# Patient Record
Sex: Male | Born: 1965 | Race: White | Hispanic: No | Smoking: Current every day smoker
Health system: Southern US, Community
[De-identification: ages and names within clinical notes are randomized; demographics above are authoritative.]

## PROBLEM LIST (undated history)

## (undated) DIAGNOSIS — Z91199 Patient's noncompliance with other medical treatment and regimen due to unspecified reason: Secondary | ICD-10-CM

## (undated) DIAGNOSIS — F329 Major depressive disorder, single episode, unspecified: Secondary | ICD-10-CM

## (undated) DIAGNOSIS — F1010 Alcohol abuse, uncomplicated: Secondary | ICD-10-CM

## (undated) DIAGNOSIS — M549 Dorsalgia, unspecified: Secondary | ICD-10-CM

## (undated) DIAGNOSIS — F1210 Cannabis abuse, uncomplicated: Secondary | ICD-10-CM

## (undated) DIAGNOSIS — F32A Depression, unspecified: Secondary | ICD-10-CM

## (undated) DIAGNOSIS — F1410 Cocaine abuse, uncomplicated: Secondary | ICD-10-CM

## (undated) DIAGNOSIS — I10 Essential (primary) hypertension: Secondary | ICD-10-CM

## (undated) DIAGNOSIS — R296 Repeated falls: Secondary | ICD-10-CM

## (undated) DIAGNOSIS — Z9119 Patient's noncompliance with other medical treatment and regimen: Secondary | ICD-10-CM

## (undated) DIAGNOSIS — F419 Anxiety disorder, unspecified: Secondary | ICD-10-CM

## (undated) DIAGNOSIS — G8929 Other chronic pain: Secondary | ICD-10-CM

## (undated) HISTORY — DX: Cannabis abuse, uncomplicated: F12.10

## (undated) HISTORY — DX: Cocaine abuse, uncomplicated: F14.10

## (undated) HISTORY — DX: Depression, unspecified: F32.A

## (undated) HISTORY — DX: Anxiety disorder, unspecified: F41.9

## (undated) HISTORY — DX: Major depressive disorder, single episode, unspecified: F32.9

---

## 1998-01-28 ENCOUNTER — Inpatient Hospital Stay (HOSPITAL_COMMUNITY): Admission: RE | Admit: 1998-01-28 | Discharge: 1998-01-31 | Payer: Self-pay | Admitting: *Deleted

## 1998-03-22 ENCOUNTER — Inpatient Hospital Stay (HOSPITAL_COMMUNITY): Admission: EM | Admit: 1998-03-22 | Discharge: 1998-03-24 | Payer: Self-pay | Admitting: Psychiatry

## 2001-10-23 ENCOUNTER — Observation Stay (HOSPITAL_COMMUNITY): Admission: AD | Admit: 2001-10-23 | Discharge: 2001-10-24 | Payer: Self-pay | Admitting: Internal Medicine

## 2002-01-30 ENCOUNTER — Ambulatory Visit (HOSPITAL_COMMUNITY): Admission: RE | Admit: 2002-01-30 | Discharge: 2002-01-30 | Payer: Self-pay | Admitting: Family Medicine

## 2002-01-30 ENCOUNTER — Encounter: Payer: Self-pay | Admitting: Family Medicine

## 2002-02-02 ENCOUNTER — Ambulatory Visit (HOSPITAL_COMMUNITY): Admission: RE | Admit: 2002-02-02 | Discharge: 2002-02-02 | Payer: Self-pay | Admitting: Family Medicine

## 2002-02-02 ENCOUNTER — Encounter: Payer: Self-pay | Admitting: Family Medicine

## 2002-10-03 ENCOUNTER — Emergency Department (HOSPITAL_COMMUNITY): Admission: EM | Admit: 2002-10-03 | Discharge: 2002-10-03 | Payer: Self-pay | Admitting: *Deleted

## 2003-05-25 ENCOUNTER — Inpatient Hospital Stay (HOSPITAL_COMMUNITY): Admission: AD | Admit: 2003-05-25 | Discharge: 2003-05-28 | Payer: Self-pay | Admitting: Psychiatry

## 2004-04-18 ENCOUNTER — Emergency Department (HOSPITAL_COMMUNITY): Admission: EM | Admit: 2004-04-18 | Discharge: 2004-04-18 | Payer: Self-pay | Admitting: Emergency Medicine

## 2004-07-23 ENCOUNTER — Ambulatory Visit (HOSPITAL_COMMUNITY): Admission: RE | Admit: 2004-07-23 | Discharge: 2004-07-23 | Payer: Self-pay | Admitting: Family Medicine

## 2005-02-09 ENCOUNTER — Ambulatory Visit (HOSPITAL_COMMUNITY): Admission: RE | Admit: 2005-02-09 | Discharge: 2005-02-09 | Payer: Self-pay | Admitting: Family Medicine

## 2005-05-13 ENCOUNTER — Ambulatory Visit (HOSPITAL_COMMUNITY): Admission: RE | Admit: 2005-05-13 | Discharge: 2005-05-14 | Payer: Self-pay | Admitting: Neurosurgery

## 2006-08-25 ENCOUNTER — Ambulatory Visit: Payer: Self-pay | Admitting: Orthopedic Surgery

## 2006-09-01 ENCOUNTER — Encounter (HOSPITAL_COMMUNITY): Admission: RE | Admit: 2006-09-01 | Discharge: 2006-10-01 | Payer: Self-pay | Admitting: Orthopedic Surgery

## 2006-09-05 ENCOUNTER — Ambulatory Visit: Payer: Self-pay | Admitting: Orthopedic Surgery

## 2006-11-07 ENCOUNTER — Ambulatory Visit: Payer: Self-pay | Admitting: Orthopedic Surgery

## 2006-11-11 ENCOUNTER — Ambulatory Visit: Payer: Self-pay | Admitting: Orthopedic Surgery

## 2006-11-11 ENCOUNTER — Ambulatory Visit (HOSPITAL_COMMUNITY): Admission: RE | Admit: 2006-11-11 | Discharge: 2006-11-11 | Payer: Self-pay | Admitting: Orthopedic Surgery

## 2006-11-14 ENCOUNTER — Ambulatory Visit: Payer: Self-pay | Admitting: Orthopedic Surgery

## 2006-12-06 ENCOUNTER — Ambulatory Visit (HOSPITAL_COMMUNITY): Admission: RE | Admit: 2006-12-06 | Discharge: 2006-12-06 | Payer: Self-pay | Admitting: Orthopedic Surgery

## 2006-12-06 ENCOUNTER — Ambulatory Visit: Payer: Self-pay | Admitting: Orthopedic Surgery

## 2006-12-27 ENCOUNTER — Ambulatory Visit: Payer: Self-pay | Admitting: Orthopedic Surgery

## 2007-03-06 ENCOUNTER — Ambulatory Visit: Payer: Self-pay | Admitting: Internal Medicine

## 2007-03-06 ENCOUNTER — Telehealth (INDEPENDENT_AMBULATORY_CARE_PROVIDER_SITE_OTHER): Payer: Self-pay | Admitting: Internal Medicine

## 2007-03-06 DIAGNOSIS — K219 Gastro-esophageal reflux disease without esophagitis: Secondary | ICD-10-CM

## 2007-03-06 DIAGNOSIS — B029 Zoster without complications: Secondary | ICD-10-CM | POA: Insufficient documentation

## 2007-03-06 DIAGNOSIS — I10 Essential (primary) hypertension: Secondary | ICD-10-CM

## 2007-03-08 ENCOUNTER — Encounter (INDEPENDENT_AMBULATORY_CARE_PROVIDER_SITE_OTHER): Payer: Self-pay | Admitting: Internal Medicine

## 2007-03-08 ENCOUNTER — Telehealth (INDEPENDENT_AMBULATORY_CARE_PROVIDER_SITE_OTHER): Payer: Self-pay | Admitting: *Deleted

## 2007-03-08 ENCOUNTER — Telehealth (INDEPENDENT_AMBULATORY_CARE_PROVIDER_SITE_OTHER): Payer: Self-pay | Admitting: Internal Medicine

## 2007-07-13 HISTORY — PX: NECK SURGERY: SHX720

## 2007-10-12 ENCOUNTER — Emergency Department (HOSPITAL_COMMUNITY): Admission: EM | Admit: 2007-10-12 | Discharge: 2007-10-12 | Payer: Self-pay | Admitting: Emergency Medicine

## 2009-04-27 ENCOUNTER — Emergency Department (HOSPITAL_COMMUNITY): Admission: EM | Admit: 2009-04-27 | Discharge: 2009-04-27 | Payer: Self-pay | Admitting: Emergency Medicine

## 2009-04-28 ENCOUNTER — Ambulatory Visit (HOSPITAL_COMMUNITY): Admission: RE | Admit: 2009-04-28 | Discharge: 2009-04-28 | Payer: Self-pay | Admitting: Emergency Medicine

## 2010-09-30 ENCOUNTER — Emergency Department (HOSPITAL_COMMUNITY): Payer: Medicaid Other

## 2010-09-30 ENCOUNTER — Inpatient Hospital Stay (HOSPITAL_COMMUNITY)
Admission: EM | Admit: 2010-09-30 | Discharge: 2010-10-12 | DRG: 199 | Disposition: A | Discharge status: Home / Self Care | Payer: Medicaid Other | Source: Ambulatory Visit | Attending: Surgery | Admitting: Surgery

## 2010-09-30 ENCOUNTER — Encounter (HOSPITAL_COMMUNITY): Payer: Self-pay | Admitting: Radiology

## 2010-09-30 DIAGNOSIS — I10 Essential (primary) hypertension: Secondary | ICD-10-CM | POA: Diagnosis present

## 2010-09-30 DIAGNOSIS — Y998 Other external cause status: Secondary | ICD-10-CM

## 2010-09-30 DIAGNOSIS — S42009A Fracture of unspecified part of unspecified clavicle, initial encounter for closed fracture: Secondary | ICD-10-CM | POA: Diagnosis present

## 2010-09-30 DIAGNOSIS — F1010 Alcohol abuse, uncomplicated: Secondary | ICD-10-CM | POA: Diagnosis present

## 2010-09-30 DIAGNOSIS — J189 Pneumonia, unspecified organism: Secondary | ICD-10-CM | POA: Diagnosis present

## 2010-09-30 DIAGNOSIS — S36113A Laceration of liver, unspecified degree, initial encounter: Secondary | ICD-10-CM | POA: Diagnosis present

## 2010-09-30 DIAGNOSIS — J42 Unspecified chronic bronchitis: Secondary | ICD-10-CM | POA: Diagnosis present

## 2010-09-30 DIAGNOSIS — J9383 Other pneumothorax: Principal | ICD-10-CM | POA: Diagnosis present

## 2010-09-30 DIAGNOSIS — E119 Type 2 diabetes mellitus without complications: Secondary | ICD-10-CM | POA: Diagnosis present

## 2010-09-30 DIAGNOSIS — F17200 Nicotine dependence, unspecified, uncomplicated: Secondary | ICD-10-CM | POA: Diagnosis present

## 2010-09-30 DIAGNOSIS — R000 Tachycardia, unspecified: Secondary | ICD-10-CM | POA: Diagnosis present

## 2010-09-30 DIAGNOSIS — D62 Acute posthemorrhagic anemia: Secondary | ICD-10-CM | POA: Diagnosis present

## 2010-09-30 DIAGNOSIS — IMO0002 Reserved for concepts with insufficient information to code with codable children: Secondary | ICD-10-CM | POA: Diagnosis present

## 2010-09-30 DIAGNOSIS — S2249XA Multiple fractures of ribs, unspecified side, initial encounter for closed fracture: Secondary | ICD-10-CM | POA: Diagnosis present

## 2010-09-30 DIAGNOSIS — S32009A Unspecified fracture of unspecified lumbar vertebra, initial encounter for closed fracture: Secondary | ICD-10-CM | POA: Diagnosis present

## 2010-09-30 LAB — DIFFERENTIAL
Basophils Relative: 1 % (ref 0–1)
Eosinophils Absolute: 0.2 10*3/uL (ref 0.0–0.7)
Monocytes Absolute: 0.6 10*3/uL (ref 0.1–1.0)
Monocytes Relative: 4 % (ref 3–12)

## 2010-09-30 LAB — COMPREHENSIVE METABOLIC PANEL
AST: 36 U/L (ref 0–37)
Albumin: 3.7 g/dL (ref 3.5–5.2)
Alkaline Phosphatase: 64 U/L (ref 39–117)
BUN: 9 mg/dL (ref 6–23)
GFR calc Af Amer: >60 mL/min (ref >60)
Potassium: 3.8 meq/L (ref 3.5–5.1)
Total Protein: 6.2 g/dL (ref 6.0–8.3)

## 2010-09-30 LAB — CBC
HCT: 40.9 % (ref 39.0–52.0)
Hemoglobin: 14.3 g/dL (ref 13.0–17.0)
RBC: 4.81 MIL/uL (ref 4.22–5.81)
RDW: 13.8 % (ref 11.5–15.5)

## 2010-09-30 LAB — ABO/RH: ABO/RH(D): A POS

## 2010-09-30 LAB — PROTIME-INR: INR: 1.03 (ref 0.00–1.49)

## 2010-09-30 LAB — ETHANOL: Alcohol, Ethyl (B): 264 mg/dL — ABNORMAL HIGH (ref 0–10)

## 2010-09-30 MED ORDER — IOHEXOL 300 MG/ML  SOLN: 100.0000 mL | Freq: Once | INTRAMUSCULAR | Status: DC | PRN

## 2010-09-30 MED ORDER — IOHEXOL 300 MG/ML  SOLN
100.0000 mL | Freq: Once | INTRAMUSCULAR | Status: AC | PRN
  Administered 2010-09-30: 100 mL via INTRAVENOUS

## 2010-10-01 ENCOUNTER — Inpatient Hospital Stay (HOSPITAL_COMMUNITY): Payer: Medicaid Other

## 2010-10-01 LAB — CBC
Hemoglobin: 12.9 g/dL — ABNORMAL LOW (ref 13.0–17.0)
Platelets: 169 10*3/uL (ref 150–400)
RBC: 4.23 MIL/uL (ref 4.22–5.81)
RDW: 14.3 % (ref 11.5–15.5)
WBC: 13.3 10*3/uL — ABNORMAL HIGH (ref 4.0–10.5)

## 2010-10-01 LAB — BASIC METABOLIC PANEL
BUN: 9 mg/dL (ref 6–23)
Creatinine, Ser: 0.96 mg/dL (ref 0.4–1.5)
GFR calc non Af Amer: >60 mL/min (ref >60)
Potassium: 3.6 mEq/L (ref 3.5–5.1)

## 2010-10-01 LAB — TYPE AND SCREEN
ABO/RH(D): A POS
Antibody Screen: NEGATIVE

## 2010-10-01 LAB — GLUCOSE, CAPILLARY
Glucose-Capillary: 301 mg/dL — ABNORMAL HIGH (ref 70–99)
Glucose-Capillary: 281 mg/dL — ABNORMAL HIGH (ref 70–99)

## 2010-10-01 LAB — MRSA PCR SCREENING: MRSA by PCR: NEGATIVE

## 2010-10-02 ENCOUNTER — Inpatient Hospital Stay (HOSPITAL_COMMUNITY): Payer: Medicaid Other

## 2010-10-02 LAB — BASIC METABOLIC PANEL
Calcium: 8.5 mg/dL (ref 8.4–10.5)
Creatinine, Ser: 0.74 mg/dL (ref 0.4–1.5)
GFR calc Af Amer: >60 mL/min (ref >60)
GFR calc non Af Amer: >60 mL/min (ref >60)
Glucose, Bld: 250 mg/dL — ABNORMAL HIGH (ref 70–99)
Sodium: 132 mEq/L — ABNORMAL LOW (ref 135–145)

## 2010-10-02 LAB — HEMOGLOBIN A1C
Hgb A1c MFr Bld: 8.8 % — ABNORMAL HIGH (ref <5.7)
Mean Plasma Glucose: 206 mg/dL — ABNORMAL HIGH (ref <117)

## 2010-10-02 LAB — CBC
HCT: 34.3 % — ABNORMAL LOW (ref 39.0–52.0)
MCH: 29.4 pg (ref 26.0–34.0)
MCHC: 34.7 g/dL (ref 30.0–36.0)
RDW: 14.2 % (ref 11.5–15.5)

## 2010-10-02 LAB — GLUCOSE, CAPILLARY

## 2010-10-02 LAB — EXPECTORATED SPUTUM ASSESSMENT W GRAM STAIN, RFLX TO RESP C

## 2010-10-03 LAB — BASIC METABOLIC PANEL
BUN: 10 mg/dL (ref 6–23)
Calcium: 8.6 mg/dL (ref 8.4–10.5)
Creatinine, Ser: 0.73 mg/dL (ref 0.4–1.5)
GFR calc Af Amer: >60 mL/min (ref >60)
GFR calc non Af Amer: >60 mL/min (ref >60)

## 2010-10-03 LAB — PROTIME-INR
INR: 1.14 (ref 0.00–1.49)
Prothrombin Time: 14.8 seconds (ref 11.6–15.2)

## 2010-10-03 LAB — CBC
MCH: 28.9 pg (ref 26.0–34.0)
MCHC: 34.2 g/dL (ref 30.0–36.0)
MCV: 84.6 fL (ref 78.0–100.0)
Platelets: 144 10*3/uL — ABNORMAL LOW (ref 150–400)
RBC: 3.77 MIL/uL — ABNORMAL LOW (ref 4.22–5.81)
RDW: 14.3 % (ref 11.5–15.5)

## 2010-10-03 LAB — GLUCOSE, CAPILLARY: Glucose-Capillary: 259 mg/dL — ABNORMAL HIGH (ref 70–99)

## 2010-10-04 ENCOUNTER — Inpatient Hospital Stay (HOSPITAL_COMMUNITY): Payer: Medicaid Other

## 2010-10-04 LAB — CBC
HCT: 30 % — ABNORMAL LOW (ref 39.0–52.0)
Hemoglobin: 10.4 g/dL — ABNORMAL LOW (ref 13.0–17.0)
MCH: 29.2 pg (ref 26.0–34.0)
MCHC: 34.7 g/dL (ref 30.0–36.0)
RBC: 3.56 MIL/uL — ABNORMAL LOW (ref 4.22–5.81)

## 2010-10-04 LAB — BASIC METABOLIC PANEL
CO2: 31 mEq/L (ref 19–32)
Calcium: 8.2 mg/dL — ABNORMAL LOW (ref 8.4–10.5)
Chloride: 93 mEq/L — ABNORMAL LOW (ref 96–112)
Creatinine, Ser: 0.6 mg/dL (ref 0.4–1.5)
GFR calc Af Amer: >60 mL/min (ref >60)
Glucose, Bld: 184 mg/dL — ABNORMAL HIGH (ref 70–99)

## 2010-10-04 LAB — GLUCOSE, CAPILLARY

## 2010-10-05 ENCOUNTER — Inpatient Hospital Stay (HOSPITAL_COMMUNITY): Payer: Medicaid Other

## 2010-10-05 LAB — CULTURE, RESPIRATORY W GRAM STAIN

## 2010-10-05 LAB — GLUCOSE, CAPILLARY
Glucose-Capillary: 208 mg/dL — ABNORMAL HIGH (ref 70–99)
Glucose-Capillary: 252 mg/dL — ABNORMAL HIGH (ref 70–99)
Glucose-Capillary: 200 mg/dL — ABNORMAL HIGH (ref 70–99)
Glucose-Capillary: 232 mg/dL — ABNORMAL HIGH (ref 70–99)
Glucose-Capillary: 170 mg/dL — ABNORMAL HIGH (ref 70–99)

## 2010-10-06 ENCOUNTER — Inpatient Hospital Stay (HOSPITAL_COMMUNITY): Payer: Medicaid Other

## 2010-10-06 LAB — GLUCOSE, CAPILLARY
Glucose-Capillary: 216 mg/dL — ABNORMAL HIGH (ref 70–99)
Glucose-Capillary: 196 mg/dL — ABNORMAL HIGH (ref 70–99)
Glucose-Capillary: 192 mg/dL — ABNORMAL HIGH (ref 70–99)

## 2010-10-06 LAB — CBC
HCT: 28.2 % — ABNORMAL LOW (ref 39.0–52.0)
MCH: 29.3 pg (ref 26.0–34.0)
MCV: 86 fL (ref 78.0–100.0)
Platelets: 184 10*3/uL (ref 150–400)
RBC: 3.28 MIL/uL — ABNORMAL LOW (ref 4.22–5.81)

## 2010-10-07 ENCOUNTER — Inpatient Hospital Stay (HOSPITAL_COMMUNITY): Payer: Medicaid Other

## 2010-10-07 LAB — GLUCOSE, CAPILLARY
Glucose-Capillary: 161 mg/dL — ABNORMAL HIGH (ref 70–99)
Glucose-Capillary: 163 mg/dL — ABNORMAL HIGH (ref 70–99)
Glucose-Capillary: 174 mg/dL — ABNORMAL HIGH (ref 70–99)

## 2010-10-07 LAB — BASIC METABOLIC PANEL
Chloride: 99 mEq/L (ref 96–112)
GFR calc Af Amer: >60 mL/min (ref >60)
GFR calc non Af Amer: >60 mL/min (ref >60)
Potassium: 4.1 mEq/L (ref 3.5–5.1)
Sodium: 138 mEq/L (ref 135–145)

## 2010-10-07 LAB — CBC
HCT: 30.2 % — ABNORMAL LOW (ref 39.0–52.0)
Hemoglobin: 10.3 g/dL — ABNORMAL LOW (ref 13.0–17.0)
MCHC: 34.1 g/dL (ref 30.0–36.0)
RBC: 3.5 MIL/uL — ABNORMAL LOW (ref 4.22–5.81)
WBC: 6.2 10*3/uL (ref 4.0–10.5)

## 2010-10-07 LAB — PROTIME-INR
INR: 1.02 (ref 0.00–1.49)
Prothrombin Time: 13.6 seconds (ref 11.6–15.2)

## 2010-10-08 ENCOUNTER — Inpatient Hospital Stay (HOSPITAL_COMMUNITY): Payer: Medicaid Other

## 2010-10-08 LAB — GLUCOSE, CAPILLARY
Glucose-Capillary: 149 mg/dL — ABNORMAL HIGH (ref 70–99)
Glucose-Capillary: 152 mg/dL — ABNORMAL HIGH (ref 70–99)
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
## 2010-10-09 ENCOUNTER — Inpatient Hospital Stay (HOSPITAL_COMMUNITY): Payer: Medicaid Other

## 2010-10-09 LAB — GLUCOSE, CAPILLARY
Glucose-Capillary: 169 mg/dL — ABNORMAL HIGH (ref 70–99)
Glucose-Capillary: 167 mg/dL — ABNORMAL HIGH (ref 70–99)

## 2010-10-10 ENCOUNTER — Inpatient Hospital Stay (HOSPITAL_COMMUNITY): Payer: Medicaid Other

## 2010-10-10 LAB — GLUCOSE, CAPILLARY
Glucose-Capillary: 120 mg/dL — ABNORMAL HIGH (ref 70–99)
Glucose-Capillary: 153 mg/dL — ABNORMAL HIGH (ref 70–99)
Glucose-Capillary: 171 mg/dL — ABNORMAL HIGH (ref 70–99)

## 2010-10-11 ENCOUNTER — Inpatient Hospital Stay (HOSPITAL_COMMUNITY): Payer: Medicaid Other

## 2010-10-11 LAB — GLUCOSE, CAPILLARY
Glucose-Capillary: 134 mg/dL — ABNORMAL HIGH (ref 70–99)
Glucose-Capillary: 138 mg/dL — ABNORMAL HIGH (ref 70–99)
Glucose-Capillary: 129 mg/dL — ABNORMAL HIGH (ref 70–99)

## 2010-10-12 ENCOUNTER — Inpatient Hospital Stay (HOSPITAL_COMMUNITY): Payer: Medicaid Other
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
## 2010-10-15 LAB — RAPID URINE DRUG SCREEN, HOSP PERFORMED
Amphetamines: NOT DETECTED
Barbiturates: NOT DETECTED
Benzodiazepines: NOT DETECTED
Cocaine: NOT DETECTED
Opiates: POSITIVE — AB
Tetrahydrocannabinol: NOT DETECTED
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
## 2010-11-05 ENCOUNTER — Emergency Department (HOSPITAL_COMMUNITY)
Admission: EM | Admit: 2010-11-05 | Discharge: 2010-11-05 | Disposition: A | Discharge status: Home / Self Care | Payer: Medicaid Other | Attending: Emergency Medicine | Admitting: Emergency Medicine

## 2010-11-05 DIAGNOSIS — M549 Dorsalgia, unspecified: Secondary | ICD-10-CM | POA: Insufficient documentation

## 2010-11-27 NOTE — Op Note (Signed)
[name], [name] NO.:  [id number]   MEDICAL RECORD NO.:  [id number]          PATIENT TYPE:  OIB   LOCATION:  3041                         FACILITY:  [facility]   PHYSICIAN:  Cristi Loron, M.D.DATE OF BIRTH:  21-Aug-1965   DATE OF PROCEDURE:  05/13/2005  DATE OF DISCHARGE:  05/14/2005                                 OPERATIVE REPORT   BRIEF HISTORY:  The patient has suffered from neck and left arm pain  consistent with a left C6 radiculopathy.  He failed medical management and  was worked up with a cervical MRI which demonstrated spondylosis at C5-C6.  I discussed the various treatment options with the patient including  surgery.  The patient has weighed the risks, benefits, and alternatives of  surgery and decided to proceed with a C5-C6 anterior cervical discectomy,  fusion, and plating.   PREOPERATIVE DIAGNOSIS:  C5-C6 spondylosis, stenosis, degenerative disc  disease, cervical radiculopathy, cervicalgia.   POSTOPERATIVE DIAGNOSIS:  C5-C6 spondylosis, stenosis, degenerative disc  disease, cervical radiculopathy, cervicalgia.   PROCEDURE:  C5-C6 extensive anterior cervical discectomy/decompression;  interbody iliac crest allograft arthrodesis; anterior cervical plating  (Codman Slimlock titanium plate and screws).   SURGEON:  Cristi Loron, M.D.   ASSISTANT:   ESTIMATED BLOOD LOSS:  100 mL.   SPECIMENS:  None.   DRAINS:  None.   COMPLICATIONS:  None.   DESCRIPTION OF PROCEDURE:  The patient was brought to the operating room by  the anesthesia team.  General endotracheal anesthesia was induced.  The  patient remained in the supine position.  A roll was placed under his  shoulders to place the neck in slight extension.  His anterior cervical  region was then prepared with Betadine scrub and Betadine solution.  Sterile  drapes were applied.  I then injected the area to be incised with Marcaine  with epinephrine solution.  I used a scalpel to  make a transverse incision  in the patient's left anterior neck.  I used the Metzenbaum scissors to  divide the platysmal muscle and to dissect medial to the sternocleidomastoid  muscle, jugular vein, and carotid artery.  I bluntly dissected down towards  the anterior cervical spine, carefully identified the esophagus and  retracted it medially.  I used Kitner swabs to clear soft tissue from the  upper exposed intervertebral disc space.  We then inserted a bent spinal  needle into the upper exposed intervertebral disc space and obtained an  interoperative radiograph to confirm our location.   We then used electrocautery to detach the medial border of the longus colli  muscles bilaterally from the C5-C6 intervertebral disc space.  We inserted  the Caspar self-retaining retractor for exposure.  We incised the C5-C6  intervertebral disc and performed a partial discectomy using Carlens curets  and pituitary forceps.  We then inserted distraction screws at C5 and C6 and  distracted the interspace.  We used the high speed drill to decorticate the  vertebral endplates at C5-C6 and drill away the remainder of the C5-C6  intervertebral disc, drill away to the posterior spondylosis, and to thin  out the  posterior longitudinal ligament.  I then incised the ligament with  the arachnoid knife and removed it with the Kerrison punch under cutting the  vertebral endplates.  We then used the Kerrison punch to perform a generous  foraminotomy about the bilateral C6 nerve roots decompressing them  bilaterally.   We now turned our attention to the arthrodesis.  We obtained iliac crest  tricortical allograft bone graft and fashioned it to the dimensions, 7 mm  height, 1 cm depth.  We inserted one bone graft into the distracted C5-C6  interspace and then removed the distraction screws.  There was a good snug  fit of bone graft.   We now turned our attention to the instrumentation.  We used a high speed   drill to remove some ventral spondylosis from the C5-C6 intervertebral disc  space so that the plate would lie flat.  We selected the appropriate length  Codman Slimlock titanium plate, we laid it on the anterior aspect of the  vertebral bodies at C5-C6.  We drilled two 14 mm holes at C5 and two at C6.  We secured the plate to the vertebral bodies by placing two 14 mm self-  tapping screws at C5, two at C6.  We obtained an interoperative radiograph  that demonstrated good position of the plate, screws, and graft.  We then  secured the screws to the plate by locking each cam and completing the  instrumentation.   We then obtained stringent hemostasis using bipolar electrocautery.  We  irrigated the wound out with Bacitracin solution and removed the solution.  We then removed the Caspar self-retaining retractor.  We inspected the  esophagus for any damage and there was none apparent.  We then  reapproximated the patient's platysmal muscle with interrupted 3-0 Vicryl  suture, the subcutaneous tissue with interrupted 3-0 Vicryl suture, and the  skin with Steri-Strips and Benzoin.  The wound was then coated with  Bacitracin ointment.  A sterile dressing was applied, the drapes were  removed.  The patient was subsequently extubated by the anesthesia team and  transported to the post anesthesia care unit in stable condition.  All  sponge, instrument and needle counts were correct at the end of the case.      Cristi Loron, M.D.  Electronically Signed     JDJ/MEDQ  D:  05/17/2005  T:  05/17/2005  Job:  [id number]

## 2010-11-27 NOTE — Op Note (Signed)
NAMEVAUGHAN, [name] NO.:  [id number]   MEDICAL RECORD NO.:  [id number]          PATIENT TYPE:  AMB   LOCATION:  DAY                           FACILITY:  [facility]   PHYSICIAN:  Vickki Hearing, M.D.DATE OF BIRTH:  Nov 10, 1965   DATE OF PROCEDURE:  11/11/2006  DATE OF DISCHARGE:                               OPERATIVE REPORT   INDICATIONS:  [name] is 45 years old.  He is an Personnel officer. He  complained of severe pain in the medial side of his knee.  His MRI  showed he had an osteochondral defect in the lateral femoral condyle.  His bone scan showed he had possible AVN.  He was treated  conservatively.  He had negative laboratory work, CBC, sed rate, C-  reactive protein.  He did not improve.  He came in for arthroscopy.   PREOPERATIVE DIAGNOSIS:  1. Torn medial meniscus,  left knee.  2. Avascular necrosis, left knee.   POSTOPERATIVE DIAGNOSES:  Osteoarthritis medial plica, left knee.   PROCEDURE:  1. Chondroplasty medial femoral condyle.  2. Excision of plica, left knee.   SURGEON:  Vickki Hearing, M.D.   ASSISTANT:  No assistant.   ANESTHESIA:  General.   SPECIMENS:  None.   DISPOSITION OF PATHOLOGY:  No pathology.   BLOOD LOSS:  None.   COMPLICATIONS:  None.   PROCEDURE AND FINDINGS:  There was a chondral lesion of the medial  femoral condyle.  There was a large medial plica.  There was some mild  changes in the trochlea, grade 1.  The condyle changes medially were  grade 2.  Remaining structures of the knee were normal.   DESCRIPTION OF PROCEDURE:  [name] marked his left knee as the  surgical site.  I countersigned it.  I updated the history and physical.  His antibiotics were started.  He was taken to the operating room for  general anesthetic.  He was placed supine on the operating table and his  left knee was prepped and draped with sterile technique.  The time-out  procedure was completed.  A lateral portal was established.   A  diagnostic arthroscopy was done.  I have mentioned the findings above  but I will repeat them.  ACL and PCL normal.  Lateral compartment  articular surfaces normal.  Meniscus normal.  Patella normal.  Trochlea  has grade 1 changes.  Medial compartment has large medial plica.  Medial  meniscus normal.  Medial femoral condyle,  grade 2 lesion.  Weightbearing surface.  Tibial plateau medial normal.   Through a medial portal, a Paragon wand was used to perform a  chondroplasty.  A probe was used to probe the menisci, ACL and chondral  surfaces.   The knee was irrigated and closed with Steri-Strips.  Injected 30 mL of  Marcaine.  We placed sterile dressings with a cryo cuff.  He was  extubated, taken to recovery room.   He has Tylox already given in the office one every 4 hours p.r.n. for  pain.  He can weightbear as tolerated.  Change dressings tomorrow.  Follow up next week.  Vickki Hearing, M.D.  Electronically Signed     SEH/MEDQ  D:  11/11/2006  T:  11/12/2006  Job:  [id number]

## 2010-11-27 NOTE — H&P (Signed)
[facility]  Patient:    [name], [name] Visit Number: [id number] MRN: [id number]          Service Type: MED Location: 2A A206 01 Attending Physician:  Malissa Hippo Dictated by:   Gardiner Coins, P.A. Admit Date:  10/23/2001                           History and Physical  CHIEF COMPLAINT:  Vomiting blood.  HISTORY OF PRESENT ILLNESS:  The patient is a very nice 45 year old white male with a history of chronic hepatitis C, genotype 1A, on antiviral therapy with negative HCV by PCr at three months who called our office this morning with complaints of nausea and episode of hematemesis.  Developed some right upper quadrant "muscle strain" type of pain and epigastric "cramping" pain approximately two to three days ago which has been intermittent since then. Woke up this morning with some nausea and then approximately 45 minutes later vomited a small dark maroon "blood clot" with approximately one-quarter to one-half cup of red, greasy-appearing emesis.  He has had no further vomiting. Remains just a little bit nauseated at this point.  Has not identified any triggers or alleviating factors for his right upper quadrant and epigastric pain.  Symptoms do not seem to be related to meals or bowel movements.  Has had a temperature in the 99 range today and is 99.4 on admission.  No other fevers or temperature.  Has been feeling run down and tired over the last couple of days.  Has history of typical reflux symptoms, generally well controlled on PPI, but he has not taken any PPI over the last two weeks as he has not had any reflux symptoms.  No history of peptic ulcer disease or previous EGD.  No history of hematemesis.  Has had a nonproductive cough over the last two to three days.  Has generally been tolerating the antiviral therapy fairly well.  He has had some problems with perceived paranoia and edginess as well as forgetfulness.  These symptoms  remain largely unchanged. Denies any suicidal ideations or homicidal ideations.  Continues to work. Appetite is good, and he continues to eat well.  Reports he has lost weight since beginning antiviral therapy, and review of records indicates he has lost about 15 pounds since September 2001.  He reports he thought he had lost about 30 pounds.  MEDICATIONS PRIOR TO ADMISSION: 1. PEG-Intron 0.5 cc weekly. 2. Ribavirin 3 capsules b.i.d. 3. Atarax 25 mg t.i.d. p.r.n. itching. 4. Tylenol 2-3 times per week p.r.n. 5. Ibuprofen rarely, perhaps 1-2 times every two weeks p.r.n. 6. Aciphex 20 mg q.d., which he has not been taking over the last two weeks.  ALLERGIES:  No known drug allergies.  PAST MEDICAL HISTORY: 1. Chronic hepatitis C.  Initially seen in consultation in September 2001.    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  Started antiviral therapy with PEG-Intron product approximately    four months ago, and at three months viral load was undetectable, with    normal liver function tests. 2. Has history of heavy ETOH use, although he has not drank recently. 3. History of intranasal cocaine abuse. 4. No history of heart disease, kidney problems, hypertension.  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  FAMILY HISTORY:  Father deceased in MVA.  Mother alive, with history of brain tumor in remission.  There is no family history of colorectal cancer, chronic liver disease, or chronic GI illness.  One sister deceased secondary to lung cancer.  SOCIAL HISTORY:  The patient has been married for 14 years.  He has a daughter.  He works as an Personnel officer.  Smokes about a pack of cigarettes per day.  No ETOH use currently but used to drink heavily.  History of drug abuse as in HPI.  REVIEW OF SYSTEMS:  As in HPI.  In  addition, no chest pains, palpitations, or tachycardia.  No dyspnea or wheezing.  No dysuria or hematuria.  PHYSICAL EXAMINATION:  VITAL SIGNS:  Height 5 feet 7 inches, weight 190.5 pounds.  Pulse 84, respirations 18, temperature 99.4, blood pressure 142/90.  GENERAL:  Very pleasant, cooperative, alert Caucasian male in no acute distress.  Answers questions quickly and appropriately.  Multiple family members at bedside.  HEENT:  Atraumatic, normocephalic.  PERRL.  Sclerae nonicteric.  Conjunctivae clear.  Oropharynx is pink and moist, without lesion, erythema, or exudate. Dentition in moderately good repair.  NECK:  Supple.  No masses.  No adenopathy.  Trachea is midline.  No JVD.  LUNGS:  Clear to auscultation bilaterally with symmetrical expansion.  CARDIOVASCULAR:  S1, S2.  Regular rate and rhythm.  Without murmur, rub, or gallop.  ABDOMEN:  Positive normoactive bowel sounds.  Soft, full.  Liver edge palpable and tender at right costal margin midclavicular line with inspiration.  Spleen easily palpated in the left upper quadrant below costal margin.  Abdomen otherwise nontender.  No masses.  EXTREMITIES:  No lower extremity edema.  With 2+ dorsalis pedis pulses bilaterally.  SKIN:  Warm and dry, with normal hair distribution.  No jaundice.  LABORATORY DATA:  None done yet today.  IMPRESSION:  The patient is a very nice 45 year old gentleman with: 1. Upper gastrointestinal bleed:  No further hematemesis since this morning.    Appears hemodynamically stable.  Suspect he has a Mallory-Weiss tear with    nausea and vomiting possibly related to acute viral illness.  Want to check    on possibility of esophagitis and peptic ulcer disease.  Platelets were    somewhat low on September 22, 2001, at 108.  He has had some epigastric    cramping-type pain. 2. Right upper quadrant discomfort:  Questionably related to antiviral therapy    versus acute viral illness versus other etiology  such as gallbladder    disease (has lost somewhere between 15 and 30 pounds since starting    antiviral therapy about four months ago).  3. Splenomegaly:  Worrisome for cirrhosis but possibly related to acute viral    syndrome. 4. Chronic hepatitis C, genotype 1A, on PEG-Intron therapy:  Undetectable    viral load at three months of therapy.  Last laboratory work from September 22, 2001, showed hemoglobin 12, hematocrit 36.2, white blood cell count 2.3,    platelets 108,000, 63% granulocytes, 31% lymphocytes, with normal liver    profile and normal TSH.  PLAN:  Supportive care.  PPI.  CBC, PT, PTT, monospot, CMP.  EGD in the morning.  Continue antiviral therapy for the time being.  Dictated by: Gardiner Coins, P.A. Attending Physician:  Malissa Hippo DD:  10/23/01 TD:  10/23/01 Job: [id number]
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
## 2010-11-27 NOTE — H&P (Signed)
NAME:  [name], BLADES NO.:  [id number]   MEDICAL RECORD NO.:  [id number]          PATIENT TYPE:  AMB   LOCATION:  DAY                           FACILITY:  [facility]   PHYSICIAN:  Vickki Hearing, M.D.DATE OF BIRTH:  August 16, 1965   DATE OF ADMISSION:  DATE OF DISCHARGE:  LH                              HISTORY & PHYSICAL   HISTORY OF PRESENT ILLNESS:  The patient is presenting for evaluation of  his left knee.  He has had continued pain with intermittent bouts of  toothache like throbbing sensation on the medial side of his knee.  His  work-up has been somewhat difficult as his MRI showed that he had  osteochondral defect, lateral femoral condyle.  All his pain is medial.  He had no arthritis seen on radiographs including PA flexion view but  still had persistent pain so we decided to go ahead and proceed with  arthroscopic surgery.  He understands that he may still have pain even  after the surgery.  He has given informed consent.   He is a 45 year old male presented to [place] in February 2008.  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  He had  three to four months of pain which involved the knee and ankle but no  injury.  He was initially treated with Tylox and Flexeril for thigh pain  and pain that exacerbated by climbing stairs.  He had some giving out  symptoms in the left leg.  He denied any hip or back problems.  I  switched him over to Lortab, checked him with a CBC, sed rate and C  reactive protein along with bone scan and they were essentially showing  that his MRI was correct. However, after he did not get better we  decided to proceed to go ahead with this knee surgery.   PAST MEDICAL HISTORY:  He does have previous surgery on his neck and  vasectomy as stated.  He has no major  medical problems.   MEDICATIONS:  He takes Avalide.  He is on Tylox for the surgery.   FAMILY HISTORY:  Cancer and diabetes.   SOCIAL HISTORY:  He  is married, an Personnel officer, smokes 1 1/2 packs of  cigarettes per day.  Highest grade completed was 9.   REVIEW OF SYSTEMS:  Joint pain, swelling, some chest pain.  No shortness  of breath, vomiting, nausea, diarrhea, weight loss, kidney failure,  thyroid disease, depression, eczema, poor vision, seasonal allergies or  lymph node disease.   PHYSICAL EXAMINATION:  VITAL SIGNS:  Weight 225, pulse 78, respirations  18.  GENERAL APPEARANCE:  Mesomorphic body habitus.  CARDIOVASCULAR:  Peripheral pulses are normal.  No temperature changes  or edema.  No tenderness.  LYMPH NODES:  Benign.  EXTREMITIES:  Gait and station show a limp.  He favors the left leg.  He  has significant medial joint line tenderness. Meniscal sign is  equivocal.  No lateral tenderness at all.  Full range of motion.  Small  joint effusion.  SKIN:  Normal.  NEUROPSYCH EXAM:  Normal.  IMPRESSION:  AVN versus torn medial meniscus.  AVN is supposed to be  lateral.  All pain is medial. Meniscal tear may be medial.   DIAGNOSIS:  Knee pain, possible AVN versus medial meniscal tear.  Recommend arthroscopy of the left knee.      Vickki Hearing, M.D.  Electronically Signed     SEH/MEDQ  D:  11/10/2006  T:  11/10/2006  Job:  [id number]

## 2011-01-16 ENCOUNTER — Encounter (HOSPITAL_COMMUNITY): Payer: Self-pay | Admitting: *Deleted

## 2011-01-16 ENCOUNTER — Emergency Department (HOSPITAL_COMMUNITY)
Admission: EM | Admit: 2011-01-16 | Discharge: 2011-01-17 | Disposition: A | Discharge status: Home / Self Care | Payer: Medicaid Other | Attending: Emergency Medicine | Admitting: Emergency Medicine

## 2011-01-16 DIAGNOSIS — R21 Rash and other nonspecific skin eruption: Secondary | ICD-10-CM

## 2011-01-16 DIAGNOSIS — F17200 Nicotine dependence, unspecified, uncomplicated: Secondary | ICD-10-CM | POA: Insufficient documentation

## 2011-01-16 DIAGNOSIS — M549 Dorsalgia, unspecified: Secondary | ICD-10-CM

## 2011-01-16 DIAGNOSIS — M545 Low back pain, unspecified: Secondary | ICD-10-CM | POA: Insufficient documentation

## 2011-01-16 DIAGNOSIS — I10 Essential (primary) hypertension: Secondary | ICD-10-CM | POA: Insufficient documentation

## 2011-01-16 DIAGNOSIS — E119 Type 2 diabetes mellitus without complications: Secondary | ICD-10-CM | POA: Insufficient documentation

## 2011-01-16 HISTORY — DX: Essential (primary) hypertension: I10

## 2011-01-16 MED ORDER — OXYCODONE-ACETAMINOPHEN 5-325 MG PO TABS
1.0000 | ORAL_TABLET | Freq: Once | ORAL | Status: AC
  Administered 2011-01-16: 1 via ORAL
  Filled 2011-01-16: qty 1

## 2011-01-16 MED ORDER — DIPHENHYDRAMINE HCL 50 MG/ML IJ SOLN
50.0000 mg | Freq: Once | INTRAMUSCULAR | Status: AC
  Administered 2011-01-16: 50 mg via INTRAMUSCULAR
  Filled 2011-01-16: qty 1
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
## 2011-01-16 NOTE — ED Notes (Signed)
MD at bedside. 

## 2011-01-16 NOTE — ED Notes (Signed)
Pt not in waiting room. 1st attempt
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
## 2011-01-17 MED ORDER — HYDROCORTISONE 0.5 % EX CREA: TOPICAL_CREAM | Freq: Three times a day (TID) | CUTANEOUS | Status: DC

## 2011-01-17 MED ORDER — CYCLOBENZAPRINE HCL 10 MG PO TABS: 10.0000 mg | ORAL_TABLET | Freq: Two times a day (BID) | ORAL | Status: AC | PRN

## 2011-01-17 MED ORDER — DIPHENHYDRAMINE HCL 25 MG PO CAPS: 25.0000 mg | ORAL_CAPSULE | Freq: Four times a day (QID) | ORAL | Status: AC | PRN

## 2011-01-17 MED ORDER — HYDROCORTISONE 1 % EX CREA: TOPICAL_CREAM | CUTANEOUS | Status: DC

## 2011-01-17 NOTE — ED Notes (Signed)
Pt states that he will call for a ride

## 2011-03-06 ENCOUNTER — Inpatient Hospital Stay (HOSPITAL_COMMUNITY)
Admission: AD | Admit: 2011-03-06 | Discharge: 2011-03-16 | DRG: 897 | Disposition: A | Discharge status: Home / Self Care | Payer: Medicaid Other | Attending: Psychiatry | Admitting: Psychiatry

## 2011-03-06 ENCOUNTER — Emergency Department (HOSPITAL_COMMUNITY)
Admission: EM | Admit: 2011-03-06 | Discharge: 2011-03-06 | Disposition: A | Discharge status: Other Acute Inpatient Hospital | Payer: Medicaid Other | Source: Home / Self Care | Attending: Emergency Medicine | Admitting: Emergency Medicine

## 2011-03-06 ENCOUNTER — Encounter (HOSPITAL_COMMUNITY): Payer: Self-pay | Admitting: *Deleted

## 2011-03-06 DIAGNOSIS — F39 Unspecified mood [affective] disorder: Secondary | ICD-10-CM

## 2011-03-06 DIAGNOSIS — E119 Type 2 diabetes mellitus without complications: Secondary | ICD-10-CM | POA: Insufficient documentation

## 2011-03-06 DIAGNOSIS — F1010 Alcohol abuse, uncomplicated: Secondary | ICD-10-CM | POA: Insufficient documentation

## 2011-03-06 DIAGNOSIS — F1910 Other psychoactive substance abuse, uncomplicated: Secondary | ICD-10-CM

## 2011-03-06 DIAGNOSIS — R45851 Suicidal ideations: Secondary | ICD-10-CM

## 2011-03-06 DIAGNOSIS — F1410 Cocaine abuse, uncomplicated: Secondary | ICD-10-CM

## 2011-03-06 DIAGNOSIS — Z560 Unemployment, unspecified: Secondary | ICD-10-CM

## 2011-03-06 DIAGNOSIS — F1020 Alcohol dependence, uncomplicated: Principal | ICD-10-CM

## 2011-03-06 DIAGNOSIS — Z79899 Other long term (current) drug therapy: Secondary | ICD-10-CM

## 2011-03-06 DIAGNOSIS — F17200 Nicotine dependence, unspecified, uncomplicated: Secondary | ICD-10-CM | POA: Insufficient documentation

## 2011-03-06 DIAGNOSIS — F1210 Cannabis abuse, uncomplicated: Secondary | ICD-10-CM

## 2011-03-06 DIAGNOSIS — I10 Essential (primary) hypertension: Secondary | ICD-10-CM

## 2011-03-06 DIAGNOSIS — F411 Generalized anxiety disorder: Secondary | ICD-10-CM

## 2011-03-06 LAB — BASIC METABOLIC PANEL
BUN: 8 mg/dL (ref 6–23)
CO2: 26 mEq/L (ref 19–32)
Calcium: 9.3 mg/dL (ref 8.4–10.5)
Chloride: 96 mEq/L (ref 96–112)
Creatinine, Ser: 0.72 mg/dL (ref 0.50–1.35)
GFR calc Af Amer: >60 mL/min (ref >60)
GFR calc non Af Amer: >60 mL/min (ref >60)
Glucose, Bld: 172 mg/dL — ABNORMAL HIGH (ref 70–99)
Potassium: 3.8 mEq/L (ref 3.5–5.1)
Sodium: 135 mEq/L (ref 135–145)

## 2011-03-06 LAB — RAPID URINE DRUG SCREEN, HOSP PERFORMED
Amphetamines: NOT DETECTED
Barbiturates: NOT DETECTED
Benzodiazepines: NOT DETECTED
Cocaine: POSITIVE — AB
Opiates: NOT DETECTED
Tetrahydrocannabinol: POSITIVE — AB

## 2011-03-06 LAB — DIFFERENTIAL
Basophils Absolute: 0 10*3/uL (ref 0.0–0.1)
Basophils Relative: 0 % (ref 0–1)
Eosinophils Absolute: 0 10*3/uL (ref 0.0–0.7)
Eosinophils Relative: 0 % (ref 0–5)
Lymphocytes Relative: 25 % (ref 12–46)
Lymphs Abs: 1.9 10*3/uL (ref 0.7–4.0)
Monocytes Absolute: 0.4 10*3/uL (ref 0.1–1.0)
Monocytes Relative: 5 % (ref 3–12)
Neutro Abs: 5.3 10*3/uL (ref 1.7–7.7)
Neutrophils Relative %: 69 % (ref 43–77)

## 2011-03-06 LAB — CBC
HCT: 41 % (ref 39.0–52.0)
Hemoglobin: 14.1 g/dL (ref 13.0–17.0)
MCH: 28.1 pg (ref 26.0–34.0)
MCHC: 34.4 g/dL (ref 30.0–36.0)
MCV: 81.8 fL (ref 78.0–100.0)
Platelets: 142 10*3/uL — ABNORMAL LOW (ref 150–400)
RBC: 5.01 MIL/uL (ref 4.22–5.81)
RDW: 16 % — ABNORMAL HIGH (ref 11.5–15.5)
WBC: 7.7 10*3/uL (ref 4.0–10.5)

## 2011-03-06 LAB — GLUCOSE, CAPILLARY
Glucose-Capillary: 190 mg/dL — ABNORMAL HIGH (ref 70–99)
Glucose-Capillary: 218 mg/dL — ABNORMAL HIGH (ref 70–99)

## 2011-03-06 LAB — ETHANOL: Alcohol, Ethyl (B): <11 mg/dL (ref 0–11)

## 2011-03-06 MED ORDER — ONDANSETRON HCL 4 MG PO TABS: 4.0000 mg | ORAL_TABLET | Freq: Three times a day (TID) | ORAL | Status: DC | PRN

## 2011-03-06 MED ORDER — LISINOPRIL 10 MG PO TABS
20.0000 mg | ORAL_TABLET | Freq: Every day | ORAL | Status: DC
  Administered 2011-03-06: 20 mg via ORAL
  Filled 2011-03-06 (×2): qty 2

## 2011-03-06 MED ORDER — CITALOPRAM HYDROBROMIDE 20 MG PO TABS
20.0000 mg | ORAL_TABLET | Freq: Every day | ORAL | Status: DC
  Administered 2011-03-06: 20 mg via ORAL
  Filled 2011-03-06 (×2): qty 1

## 2011-03-06 MED ORDER — IBUPROFEN 400 MG PO TABS: 600.0000 mg | ORAL_TABLET | Freq: Three times a day (TID) | ORAL | Status: DC | PRN

## 2011-03-06 MED ORDER — LORAZEPAM 1 MG PO TABS
1.0000 mg | ORAL_TABLET | Freq: Three times a day (TID) | ORAL | Status: DC
  Administered 2011-03-06: 1 mg via ORAL
  Filled 2011-03-06: qty 1

## 2011-03-06 MED ORDER — METFORMIN HCL 500 MG PO TABS
500.0000 mg | ORAL_TABLET | Freq: Two times a day (BID) | ORAL | Status: DC
  Administered 2011-03-06 (×2): 500 mg via ORAL
  Filled 2011-03-06 (×2): qty 1

## 2011-03-06 MED ORDER — ACETAMINOPHEN 325 MG PO TABS: 650.0000 mg | ORAL_TABLET | ORAL | Status: DC | PRN

## 2011-03-06 MED ORDER — LORAZEPAM 1 MG PO TABS
1.0000 mg | ORAL_TABLET | Freq: Three times a day (TID) | ORAL | Status: DC | PRN
  Administered 2011-03-06: 1 mg via ORAL
  Filled 2011-03-06: qty 1

## 2011-03-06 MED ORDER — HYDROCHLOROTHIAZIDE 12.5 MG PO CAPS
12.5000 mg | ORAL_CAPSULE | Freq: Every day | ORAL | Status: DC
  Administered 2011-03-06: 12.5 mg via ORAL
  Filled 2011-03-06 (×2): qty 1

## 2011-03-06 MED ORDER — LISINOPRIL 20 MG PO TABS
20.0000 mg | ORAL_TABLET | Freq: Every day | ORAL | Status: DC
  Filled 2011-03-06 (×2): qty 1

## 2011-03-06 MED ORDER — NICOTINE 21 MG/24HR TD PT24
21.0000 mg | MEDICATED_PATCH | Freq: Every day | TRANSDERMAL | Status: DC
  Administered 2011-03-06: 21 mg via TRANSDERMAL
  Filled 2011-03-06: qty 1

## 2011-03-06 NOTE — ED Notes (Signed)
Pt resting calmly w/ eyes closed. Rise & fall of the chest noted. NAD noted at this time.  Sitter remains outside room at this time. 

## 2011-03-06 NOTE — ED Notes (Signed)
Pt now stating he has had thoughts of suicide today, when asked if he had a plan, he stated "I will jump out in front of a car."

## 2011-03-06 NOTE — ED Notes (Signed)
Reported by pt sitter that pt output approx 4L throughout the night; pt intake IVF 1L and approx 8oz of po intake.  EDP notified.

## 2011-03-06 NOTE — ED Notes (Signed)
Felissa of [facility] here to evaluate.

## 2011-03-06 NOTE — ED Notes (Signed)
Pt given phone to advise family of transport to another facility.

## 2011-03-06 NOTE — ED Notes (Signed)
EDP aware of pts SI will reassess pt.

## 2011-03-06 NOTE — ED Notes (Signed)
Pt arrived via [facility], pt requesting detox from drugs and alcohol, pt reports he used crack cocaine approx 1 hr ago along with etoh, pt reports he is depressed but denies any SI, pt also reports he has been out of his meds for htn and diabetes for the past 3 mts

## 2011-03-06 NOTE — ED Notes (Signed)
[place] [facility] called for transport to [facility].

## 2011-03-06 NOTE — ED Notes (Signed)
Advised by [facility] member pt has been accepted at [facility]. edp has been notified.

## 2011-03-06 NOTE — ED Provider Notes (Signed)
History     CSN: [id number] Arrival date & time: 03/06/2011  2:46 AM  Chief Complaint  Patient presents with  . Drug / Alcohol Assessment   HPI Comments: Pt reports he drinks etoh 2-3times per week, and tonight drank ETOH and then smoked crack cocaine No h/o withdrawal seizures    Patient is a 45 y.o. male presenting with drug/alcohol assessment. The history is provided by the patient (family member).  Drug / Alcohol Assessment Primary symptoms include no loss of consciousness, no seizures, no weakness, no agitation, no delusions and no hallucinations. This is a recurrent problem. The current episode started more than 1 week ago. The problem has been gradually worsening. Suspected agents include alcohol and cocaine. Pertinent negatives include no injury and no vomiting.    Past Medical History  Diagnosis Date  . Hypertension   . Diabetes mellitus     History reviewed. No pertinent past surgical history.  Family History  Problem Relation Age of Onset  . Diabetes Brother     History  Substance Use Topics  . Smoking status: Current Everyday Smoker  . Smokeless tobacco: Not on file  . Alcohol Use: Yes      Review of Systems  Gastrointestinal: Negative for vomiting.  Neurological: Negative for seizures, loss of consciousness and weakness.  Psychiatric/Behavioral: Negative for hallucinations and agitation.  All other systems reviewed and are negative.    Physical Exam  BP 157/101  Pulse 99  Temp(Src) 98.2 F (36.8 C) (Oral)  Ht 5\' 7"  (1.702 m)  Wt 175 lb (79.379 kg)  BMI 27.41 kg/m2  SpO2 96%  Physical Exam  CONSTITUTIONAL: Well developed/well nourished HEAD AND FACE: Normocephalic/atraumatic EYES: EOMI/PERRL ENMT: Mucous membranes moist NECK: supple no meningeal signs CV: S1/S2 noted, no murmurs/rubs/gallops noted LUNGS: Lungs are clear to auscultation bilaterally, no apparent distress ABDOMEN: soft, nontender, no rebound or guarding NEURO: Pt is  awake/alert, moves all extremitiesx4 EXTREMITIES: pulses normal, full ROM SKIN: warm, color normal PSYCH: no abnormalities of mood noted   ED Course  Procedures  MDM Nursing notes reviewed and considered in documentation All labs/vitals reviewed and considered   Pt initially denied SI, but later told nursing that he plans to jump in front of a car.  I spoke to dr Henderson Cloud, telepsych, and he will consult with patient     Joya Gaskins, MD 03/07/11 [phone]

## 2011-03-06 NOTE — ED Notes (Signed)
Pt states he is not SI/HI just wants help with drugs and alcohol.

## 2011-03-06 NOTE — ED Notes (Signed)
Pt eating breakfast.  Glucophage and nicotine patch given.  Sitter at bedside.  telepsyche machine at bedside waiting for psych consult.

## 2011-03-06 NOTE — ED Notes (Signed)
Pt talking to psychiatrist through telepsyche.

## 2011-03-06 NOTE — ED Notes (Signed)
Assumed c/o pt; report rec'd from L. Cardwell, RN. Pt resting quietly in no distress with sitter at bedside.

## 2011-03-06 NOTE — ED Notes (Signed)
Per telepsych consult, inpatient admission recommended; [name] with [facility] contacted and will be into evaluate.

## 2011-03-06 NOTE — ED Notes (Signed)
[facility] called at this time to pick up pt. Pt is ready for transport at this time. Ezzie Senat

## 2011-03-07 DIAGNOSIS — F1410 Cocaine abuse, uncomplicated: Secondary | ICD-10-CM

## 2011-03-07 DIAGNOSIS — F17200 Nicotine dependence, unspecified, uncomplicated: Secondary | ICD-10-CM

## 2011-03-07 DIAGNOSIS — F1994 Other psychoactive substance use, unspecified with psychoactive substance-induced mood disorder: Secondary | ICD-10-CM

## 2011-03-07 DIAGNOSIS — F1210 Cannabis abuse, uncomplicated: Secondary | ICD-10-CM

## 2011-03-07 LAB — GLUCOSE, CAPILLARY: Glucose-Capillary: 260 mg/dL — ABNORMAL HIGH (ref 70–99)

## 2011-03-08 LAB — GLUCOSE, CAPILLARY
Glucose-Capillary: 210 mg/dL — ABNORMAL HIGH (ref 70–99)
Glucose-Capillary: 206 mg/dL — ABNORMAL HIGH (ref 70–99)

## 2011-03-09 LAB — GLUCOSE, CAPILLARY: Glucose-Capillary: 201 mg/dL — ABNORMAL HIGH (ref 70–99)

## 2011-03-11 LAB — GLUCOSE, CAPILLARY
Glucose-Capillary: 220 mg/dL — ABNORMAL HIGH (ref 70–99)
Glucose-Capillary: 246 mg/dL — ABNORMAL HIGH (ref 70–99)

## 2011-03-12 LAB — GLUCOSE, CAPILLARY: Glucose-Capillary: 267 mg/dL — ABNORMAL HIGH (ref 70–99)

## 2011-03-13 DIAGNOSIS — F39 Unspecified mood [affective] disorder: Secondary | ICD-10-CM

## 2011-03-13 DIAGNOSIS — F1020 Alcohol dependence, uncomplicated: Secondary | ICD-10-CM

## 2011-03-13 LAB — GLUCOSE, CAPILLARY
Glucose-Capillary: 257 mg/dL — ABNORMAL HIGH (ref 70–99)
Glucose-Capillary: 257 mg/dL — ABNORMAL HIGH (ref 70–99)

## 2011-03-14 LAB — GLUCOSE, CAPILLARY
Glucose-Capillary: 267 mg/dL — ABNORMAL HIGH (ref 70–99)
Glucose-Capillary: 231 mg/dL — ABNORMAL HIGH (ref 70–99)
Glucose-Capillary: 240 mg/dL — ABNORMAL HIGH (ref 70–99)

## 2011-03-15 LAB — GLUCOSE, CAPILLARY
Glucose-Capillary: 321 mg/dL — ABNORMAL HIGH (ref 70–99)
Glucose-Capillary: 268 mg/dL — ABNORMAL HIGH (ref 70–99)

## 2011-03-16 LAB — HEMOGLOBIN A1C
Hgb A1c MFr Bld: 7.9 % — ABNORMAL HIGH (ref <5.7)
Mean Plasma Glucose: 180 mg/dL — ABNORMAL HIGH (ref <117)

## 2011-03-16 LAB — GLUCOSE, CAPILLARY: Glucose-Capillary: 275 mg/dL — ABNORMAL HIGH (ref 70–99)

## 2011-03-18 NOTE — Assessment & Plan Note (Signed)
NAMEMarland Kitchen  Adams, [name] NO.:  [id number]  MEDICAL RECORD NO.:  [id number]  LOCATION:                                FACILITY:  [facility]  PHYSICIAN:  Leon Aloe, MD       DATE OF BIRTH:  01/06/1966  DATE OF ADMISSION:  03/06/2011 DATE OF DISCHARGE:                      PSYCHIATRIC ADMISSION ASSESSMENT   HISTORY OF PRESENT ILLNESS:  This is a 45 year old widowed man.  The commitment papers indicate that he has been abusing alcohol and cocaine. He was suicidal.  He could not contract for safety, and hence, it was felt that he needed inpatient care.  He was brought to the ED by EMS requesting detox from cocaine.  When he was told there was no detox for cocaine, he declared he was suicidal with a plan to run in front of a car.  Telepsychiatry was called, and they recommended inpatient treatment.  When at evaluated and asked why he did not say he was suicidal initially, he stated, "I just did not tell the truth, I did not want to be locked away."  He states that he had 1 prior admission back in the 1990s.  PAST PSYCHIATRIC HISTORY:  As already stated, he states 1 prior admission back in the 90s.  He is not under outpatient care.  SOCIAL HISTORY:  He apparently lives with his mother and daughter.  He receives disability.  FAMILY HISTORY:  Not significant.  ALCOHOL AND DRUG HISTORY:  His UDS was positive for cocaine and marijuana.  He had no alcohol, although, he does use alcohol and smoke.  PRIMARY CARE PROVIDER:  He does not have one.  He is known, however, to have diabetes and hypertension.  MEDICATIONS:  Apparently, he is was not taking his diabetic or hypertensive medications.  He is supposed to be taking Metformin 500 mg p.o. b.i.d., and he was just put on lisinopril 20 mg p.o. q.a.m. for hypertension.  DRUG ALLERGIES:  No known drug allergies.  POSITIVE PHYSICAL FINDINGS:  He was medically cleared in the ED at [facility] and other than his UDS being  positive for marijuana and cocaine, he did not have any abnormal physical findings.  His glucose ranged from a high of 190 down to 101, and he had no measurable alcohol.  His blood pressure did not require a patch or any unusual attention while he was in the ED.  MENTAL STATUS EXAM:  He was seen and evaluated by Dr. Cecille Adams.  He feels that the patient was calm.  He was passively cooperative.  He was disheveled, poor grooming.  He had normal speech, however.  His mood was unhappy and sad.  His thought process was goal directed.  Cognitively, he was fairly intact.  Immediate and remote memories were intact.  ADMISSION DIAGNOSES:  AXIS I:  Was cocaine abuse marijuana abuse, substance-induced mood disorder and nicotine dependence. AXIS II:  Deferred. AXIS III:  Hypertension and diabetes. AXIS IV:  Moderate to severe.  He is having variety of psychosocial issues, problems with primary support group, social environment, occupational and economic, access to healthcare, lost his wife last year and his medical problems.  PLAN:  The plan was to admit to  [facility] adult unit, involuntarily committed, his medications were to be verified and restarted, get a second opinion and find him some outpatient follow-up.     [name], P.A.-C.   ______________________________ Leon Aloe, MD    MD/MEDQ  D:  03/08/2011  T:  03/09/2011  Job:  [id number]  Electronically Signed by Jaci Lazier ADAMS P.A.-C. on 03/14/2011 01:07:01 PM Electronically Signed by [name]  on 03/18/2011 04:27:43 PM

## 2011-03-18 NOTE — Discharge Summary (Signed)
[name], [name] NO.:  [id number]  MEDICAL RECORD NO.:  [id number]  LOCATION:  0305                          FACILITY:  [facility]  PHYSICIAN:  Leon Aloe, MD       DATE OF BIRTH:  1965-12-05  DATE OF ADMISSION:  03/06/2011 DATE OF DISCHARGE:  03/16/2011                              DISCHARGE SUMMARY   This is an involuntarily admission to the service of Dr. Orson Adams.  IDENTIFYING INFORMATION:  This is a 45 year old, widowed, Caucasian male.  HISTORY OF PRESENT ILLNESS:  First [facility] admission for [name], who presents with suicidal thoughts and was brought to our emergency room by EMS, feeling that he could not be safe at home and admitted to abusing alcohol and cocaine.  He reported that he was suicidal with a plan to run into traffic.  He was evaluated by tele-psychiatry in the emergency room, who felt that he was significantly suicidal and required inpatient hospitalization.  He reports using cocaine since the age of 62 and is using it about once a week for the past 6 months, and last used 1 week ago.  He also is smoking crack cocaine, which he used just yesterday and has been using 2 to 3 times a week, smoking rock for the last 6 months. He smokes marijuana about once weekly and drinks alcohol 3 to 5 times per week, most recently drank a 12-pack on August 24th.  PAST PSYCHIATRIC HISTORY:  He has one previous admission to [facility] for psychiatric reasons for suicidal thoughts and depression and substance abuse in the early 1990s.  He is evasive about his longest period of sobriety, believes it is several weeks or several months.  He denies a previous history of seizures and is receiving no current outpatient treatment.  MEDICAL EVALUATION AND DIAGNOSTIC STUDIES:  He was medically evaluated in the emergency room at [facility].  His urine drug screen was found to be positive for marijuana and cocaine.  No history of previous seizures. Motor was  nonfocal and he is a normally developed male.  CHRONIC MEDICAL PROBLEMS:  Include history of diabetes mellitus type 2 and was has been apparently inconsistent with taking his home medications.  He is also taking lisinopril 20 mg apparently for hypertension.  Primary care physician is unknown.  Alcohol screen in the emergency room was negative.  Random glucose was 172 in the emergency room, normal electrolytes, BUN 8, creatinine 0.72.  COURSE OF HOSPITALIZATION:  He was admitted to our dual diagnosis unit and we are restarted his routine medications including metformin 500 mg b.i.d.  We started him on an alcohol detox protocol using Librium with a goal of a safe detox in 5 days.  He had previously taken Celexa 20 mg for depression, which we elected to continue, and continue lisinopril at 20 mg daily and HCTZ 12.5 mg, both for hypertension.  He was gradually assimilated into the milieu and his interactions with peer and staff were appropriate while on the unit.  Group participation was satisfactory.  His detox was uneventful and he tolerated the Librium well.  He did have some problems with insomnia and was started on clonidine 0.1 mg p.o.  q.h.s.  He was started on a low dose of Risperdal after complaining of problems with chronic underlying paranoia.  These get especially worse when he gets around in groups and has to interact with people.  He denied any auditory hallucinations.  On the 27th, he was still complaining of various withdrawal symptoms, but was making satisfactory progress with detox.  He complained of having chronic depressive symptoms since his wife was deceased about a year and a half ago.  We encouraged him to ventilate about his bereavement issues in group and encouraged him to journal about his feelings while he was on the unit.  By the 29th, he was making significant progress with detox and agreed to 30-day treatment at [facility] residential program and was accepted  there.  Meanwhile, his CBGs varied from 201 mg/dL to 960 mg/dL q.a.m., and his hemoglobin A1c was found to be 7.9.  He received sliding scale insulin while on the unit and eventually was discharged with follow-up instructions and discharged on oral metformin.  He was denying any suicidal thoughts by September 2nd, felt better on the medications, and we elected to give him a trial of low-dose Thorazine to help with his anxiety and paranoia.  He was ready for discharge to [facility] by March 16, 2011.  DISCHARGE DIAGNOSES:  Axis I:  Alcohol dependence.  Mood disorder not otherwise specified.  Nicotine dependence.  Anxiety disorder not otherwise specified. Axis II:  Deferred. Axis III:  Diabetes mellitus type 2, poorly controlled. Axis IV:  Moderate problems with bereavement, chronic. Axis V: Current 50, past year not known.  DISCHARGE CONDITION:  Stable and improved.  DISCHARGE FOLLOW-UP PLAN:  [facility] residential treatment services, September 4th at 8:00 a.m.  He was instructed to follow up with the free clinic in [place] for his diabetes and hypertension.  DISCHARGE MEDICATIONS: 1. Vistaril 50 mg t.i.d. 2. Lisinopril 20 mg daily. 3. Hydrochlorothiazide 12.5 mg daily. 4. Benicar 20 mg daily. 5. Celexa 30 mg daily. 6. Metformin 500 mg b.i.d. with meals. 7. Trazodone 50 mg h.s. p.r.n. insomnia. 8. Thorazine 50 mg q.4 hours p.r.n. for anxiety or voices.     Margaret A. Lorin Picket, N.P.   ______________________________ Leon Aloe, MD    MAS/MEDQ  D:  03/17/2011  T:  03/17/2011  Job:  [id number]  Electronically Signed by [name] N.P. on 03/18/2011 09:42:40 AM Electronically Signed by [name]  on 03/18/2011 04:27:57 PM

## 2011-04-06 ENCOUNTER — Encounter (HOSPITAL_COMMUNITY): Payer: Self-pay | Admitting: *Deleted

## 2011-04-06 ENCOUNTER — Emergency Department (HOSPITAL_COMMUNITY)
Admission: EM | Admit: 2011-04-06 | Discharge: 2011-04-07 | Disposition: A | Discharge status: Other Acute Inpatient Hospital | Payer: Medicaid Other | Source: Home / Self Care | Attending: Emergency Medicine | Admitting: Emergency Medicine

## 2011-04-06 DIAGNOSIS — I10 Essential (primary) hypertension: Secondary | ICD-10-CM | POA: Insufficient documentation

## 2011-04-06 DIAGNOSIS — E119 Type 2 diabetes mellitus without complications: Secondary | ICD-10-CM | POA: Insufficient documentation

## 2011-04-06 DIAGNOSIS — M25519 Pain in unspecified shoulder: Secondary | ICD-10-CM | POA: Insufficient documentation

## 2011-04-06 DIAGNOSIS — R0602 Shortness of breath: Secondary | ICD-10-CM | POA: Insufficient documentation

## 2011-04-06 DIAGNOSIS — F489 Nonpsychotic mental disorder, unspecified: Secondary | ICD-10-CM | POA: Insufficient documentation

## 2011-04-06 DIAGNOSIS — F17200 Nicotine dependence, unspecified, uncomplicated: Secondary | ICD-10-CM | POA: Insufficient documentation

## 2011-04-06 LAB — CBC
HCT: 36.2 % — ABNORMAL LOW (ref 39.0–52.0)
Hemoglobin: 12.9 g/dL — ABNORMAL LOW (ref 13.0–17.0)
RDW: 13.8 % (ref 11.5–15.5)
WBC: 6.8 10*3/uL (ref 4.0–10.5)

## 2011-04-06 LAB — BASIC METABOLIC PANEL
CO2: 21 mEq/L (ref 19–32)
Chloride: 102 mEq/L (ref 96–112)
Creatinine, Ser: 0.87 mg/dL (ref 0.50–1.35)
GFR calc Af Amer: >60 mL/min (ref >60)
Potassium: 3.9 mEq/L (ref 3.5–5.1)

## 2011-04-06 LAB — DIFFERENTIAL
Basophils Absolute: 0 10*3/uL (ref 0.0–0.1)
Basophils Relative: 1 % (ref 0–1)
Lymphocytes Relative: 19 % (ref 12–46)
Monocytes Absolute: 0.4 10*3/uL (ref 0.1–1.0)
Monocytes Relative: 6 % (ref 3–12)
Neutro Abs: 5.1 10*3/uL (ref 1.7–7.7)
Neutrophils Relative %: 74 % (ref 43–77)

## 2011-04-06 LAB — RAPID URINE DRUG SCREEN, HOSP PERFORMED
Barbiturates: NOT DETECTED
Tetrahydrocannabinol: POSITIVE — AB

## 2011-04-06 LAB — ETHANOL: Alcohol, Ethyl (B): 32 mg/dL — ABNORMAL HIGH (ref 0–11)

## 2011-04-06 MED ORDER — HYDROCODONE-ACETAMINOPHEN 5-325 MG PO TABS
1.0000 | ORAL_TABLET | Freq: Once | ORAL | Status: AC
  Administered 2011-04-06: 1 via ORAL
  Filled 2011-04-06: qty 1

## 2011-04-06 NOTE — ED Notes (Signed)
Pt arrived with [facility] after attempted suicide attempt by hanging, no obvious marking to neck noted

## 2011-04-06 NOTE — ED Provider Notes (Signed)
History     CSN: [id number] Arrival date & time: 04/06/2011  7:36 PM  Chief Complaint  Patient presents with  . Suicide Attempt    HPI  (Consider location/radiation/quality/duration/timing/severity/associated sxs/prior treatment)  The history is provided by the patient.  BROUGHT IN BY [facility] ON HOLD CALLED FOR ATTEMPTED HANGING BY THE PATIENT. HE WANTS TO DIE. TRIED TO HANG HIMSELF WITH A WIRE BUT IT BROKE. STILL FEELS SUICIDAL. ONLY OTHER C/O IS LEFT SHOULDER PAIN BUT NOT DISLOCATED. INJURY FROM IS RELATED TO HANDCUFFS.   Past Medical History  Diagnosis Date  . Hypertension   . Diabetes mellitus     Past Surgical History  Procedure Date  . Neck surgery     Family History  Problem Relation Age of Onset  . Diabetes Brother     History  Substance Use Topics  . Smoking status: Current Everyday Smoker  . Smokeless tobacco: Not on file  . Alcohol Use: Yes      Review of Systems  Review of Systems  Constitutional: Negative for fever.  HENT: Negative for congestion, neck pain and neck stiffness.   Eyes: Negative for redness and visual disturbance.  Respiratory: Positive for shortness of breath. Negative for cough, choking, chest tightness and stridor.   Cardiovascular: Negative for chest pain, palpitations and leg swelling.  Gastrointestinal: Negative for nausea, vomiting, abdominal pain and diarrhea.  Genitourinary: Negative for dysuria.  Musculoskeletal: Negative for back pain.  Neurological: Negative for weakness, numbness and headaches.  Psychiatric/Behavioral: Negative for confusion.       SUICIDAL     Allergies  Review of patient's allergies indicates no known allergies.  Home Medications   Current Outpatient Rx  Name Route Sig Dispense Refill  . METFORMIN HCL 500 MG PO TABS Oral Take 500 mg by mouth 2 (two) times daily with a meal.      . HYDROCORTISONE 1 % EX CREA  Apply to affected area 2 times daily 15 g 0  . UNKNOWN TO PATIENT         Physical  Exam    BP 135/92  Pulse 105  Temp(Src) 98.8 F (37.1 C) (Oral)  Resp 22  Ht 5\' 7"  (1.702 m)  Wt 170 lb (77.111 kg)  BMI 26.63 kg/m2  SpO2 97%  Physical Exam  Nursing note and vitals reviewed. Constitutional: He is oriented to person, place, and time. He appears well-developed and well-nourished. No distress.  HENT:  Head: Normocephalic and atraumatic.  Mouth/Throat: Oropharynx is clear and moist.       NO MARKS ON NECK OLD SURGICAL SCAR WELL HEALED FROM NECK DISC SURGERY.   Cardiovascular: Normal rate, regular rhythm and normal heart sounds.   No murmur heard. Pulmonary/Chest: Effort normal and breath sounds normal. No respiratory distress. He has no wheezes. He has no rales. He exhibits no tenderness.  Abdominal: Soft. Bowel sounds are normal. There is no tenderness.  Musculoskeletal: Normal range of motion. He exhibits no edema and no tenderness.       LEFT SHOULDER GOOD ROM.   Neurological: He is alert and oriented to person, place, and time. No cranial nerve deficit. He exhibits normal muscle tone. Coordination normal.  Skin: Skin is warm. No rash noted.    ED Course  Procedures (including critical care time)  Labs Reviewed  URINE RAPID DRUG SCREEN (HOSP PERFORMED) - Abnormal; Notable for the following:    Cocaine POSITIVE (*)    Tetrahydrocannabinol POSITIVE (*)    All other components within normal  limits  CBC - Abnormal; Notable for the following:    Hemoglobin 12.9 (*)    HCT 36.2 (*)    Platelets 146 (*)    All other components within normal limits  BASIC METABOLIC PANEL - Abnormal; Notable for the following:    Glucose, Bld 209 (*)    All other components within normal limits  ETHANOL - Abnormal; Notable for the following:    Alcohol, Ethyl (B) 32 (*)    All other components within normal limits  DIFFERENTIAL      MDM SIG SI AND SEEN BY ACT TEAM MEDICALLY CLEARED AND WILL NEED BEHAVIORAL HEALTH ADMISSION.         Shelda Jakes,  MD 04/06/11 [phone]

## 2011-04-06 NOTE — ED Notes (Signed)
Pt given portable phone to call his daughter.

## 2011-04-06 NOTE — ED Notes (Signed)
Pt asked for food several times. Pt given a heated frozen type dinner and was explained to that the cafeteria was closed, Pt very upset stated "would not eat that  Fu*king slop. "  Explained to pt that we had been very busy with higher acuity pt. Pt quickly dismissed me.

## 2011-04-06 NOTE — ED Notes (Signed)
Pt arrived in ED  Cuffs intact, with law enforcement.  Pt states " does not want to live, no one cares about him". Pt has an 47 yr old  Daughter that he states lives with her cousin, "wants nothing to do with me! neither does my momma ,cuz I'm not her favorite." Pt has a great deal of self pity as he expresses hopelessness.  Law Engineer, manufacturing systems states was dispatched secondary to an attempted suicide.  Pt was in front yard attempting to hang self. Pt reports to me "called 911 to report his sister in laws alleged activities. And did not tell them he was suicidal. Pt is calm, cooperative but continues to state wants to go to kill himself. Security wand pt. Pt placed in paper scrubs.

## 2011-04-07 ENCOUNTER — Inpatient Hospital Stay (HOSPITAL_COMMUNITY)
Admission: EM | Admit: 2011-04-07 | Discharge: 2011-04-13 | DRG: 885 | Disposition: A | Discharge status: Home / Self Care | Payer: Medicaid Other | Attending: Psychiatry | Admitting: Psychiatry

## 2011-04-07 DIAGNOSIS — R45851 Suicidal ideations: Secondary | ICD-10-CM

## 2011-04-07 DIAGNOSIS — Z79899 Other long term (current) drug therapy: Secondary | ICD-10-CM

## 2011-04-07 DIAGNOSIS — Z6379 Other stressful life events affecting family and household: Secondary | ICD-10-CM

## 2011-04-07 DIAGNOSIS — F1910 Other psychoactive substance abuse, uncomplicated: Secondary | ICD-10-CM

## 2011-04-07 DIAGNOSIS — F333 Major depressive disorder, recurrent, severe with psychotic symptoms: Secondary | ICD-10-CM

## 2011-04-07 DIAGNOSIS — E119 Type 2 diabetes mellitus without complications: Secondary | ICD-10-CM

## 2011-04-07 DIAGNOSIS — F1010 Alcohol abuse, uncomplicated: Secondary | ICD-10-CM

## 2011-04-07 DIAGNOSIS — Z560 Unemployment, unspecified: Secondary | ICD-10-CM

## 2011-04-07 DIAGNOSIS — Z794 Long term (current) use of insulin: Secondary | ICD-10-CM

## 2011-04-07 LAB — GLUCOSE, CAPILLARY
Glucose-Capillary: 206 mg/dL — ABNORMAL HIGH (ref 70–99)
Glucose-Capillary: 236 mg/dL — ABNORMAL HIGH (ref 70–99)
Glucose-Capillary: 254 mg/dL — ABNORMAL HIGH (ref 70–99)

## 2011-04-08 LAB — LIPID PANEL
LDL Cholesterol: 45 mg/dL (ref 0–99)
Triglycerides: 293 mg/dL — ABNORMAL HIGH (ref <150)
VLDL: 59 mg/dL — ABNORMAL HIGH (ref 0–40)

## 2011-04-08 LAB — GLUCOSE, CAPILLARY: Glucose-Capillary: 178 mg/dL — ABNORMAL HIGH (ref 70–99)

## 2011-04-08 LAB — RPR: RPR Ser Ql: NONREACTIVE

## 2011-04-09 LAB — GLUCOSE, CAPILLARY
Glucose-Capillary: 211 mg/dL — ABNORMAL HIGH (ref 70–99)
Glucose-Capillary: 211 mg/dL — ABNORMAL HIGH (ref 70–99)

## 2011-04-10 LAB — GLUCOSE, CAPILLARY
Glucose-Capillary: 238 mg/dL — ABNORMAL HIGH (ref 70–99)
Glucose-Capillary: 232 mg/dL — ABNORMAL HIGH (ref 70–99)
Glucose-Capillary: 274 mg/dL — ABNORMAL HIGH (ref 70–99)

## 2011-04-11 LAB — GLUCOSE, CAPILLARY
Glucose-Capillary: 206 mg/dL — ABNORMAL HIGH (ref 70–99)
Glucose-Capillary: 226 mg/dL — ABNORMAL HIGH (ref 70–99)

## 2011-04-12 DIAGNOSIS — F1910 Other psychoactive substance abuse, uncomplicated: Secondary | ICD-10-CM

## 2011-04-12 DIAGNOSIS — F1010 Alcohol abuse, uncomplicated: Secondary | ICD-10-CM

## 2011-04-12 DIAGNOSIS — F333 Major depressive disorder, recurrent, severe with psychotic symptoms: Secondary | ICD-10-CM

## 2011-04-12 LAB — GLUCOSE, CAPILLARY
Glucose-Capillary: 230 mg/dL — ABNORMAL HIGH (ref 70–99)
Glucose-Capillary: 213 mg/dL — ABNORMAL HIGH (ref 70–99)
Glucose-Capillary: 261 mg/dL — ABNORMAL HIGH (ref 70–99)

## 2011-04-13 LAB — GLUCOSE, CAPILLARY: Glucose-Capillary: 218 mg/dL — ABNORMAL HIGH (ref 70–99)

## 2011-04-21 NOTE — Discharge Summary (Signed)
[name], ACREE NO.:  [id number]  MEDICAL RECORD NO.:  [id number]  LOCATION:  0306                          FACILITY:  [facility]  PHYSICIAN:  Orson Aloe, MD       DATE OF BIRTH:  Nov 28, 1965  DATE OF ADMISSION:  04/07/2011 DATE OF DISCHARGE:  04/13/2011                              DISCHARGE SUMMARY   IDENTIFYING INFORMATION:  This is a 45 year old widowed Caucasian male. This is a voluntary admission.  HISTORY OF PRESENT ILLNESS:  [place] presented to [place] by way of law enforcement who came after he contacted them and indicated he was agitated with intent to harm himself.  He also reported that he was very angry with his sister-in-law who he said was selling opiates to children.  Law enforcement arrived to find him attempting to hang himself, and he reports he had tied a rope around his neck or used a wire (it is unclear), but had not yet hung himself.  Today, he reports he is also very stressed and frustrated by his 60 year old daughter that does not care about him and will not communicate with him.  He reports that he had maintained abstinence from substances after discharge from [facility] on September 4 until the week prior to this episode. He reports he has had some deep-seated anger toward his sister and will not deny homicidal thoughts.  He reports ongoing grief since the death of his wife in 04-Feb-2011at the age of 25 from complications of breast cancer.  MEDICAL EVALUATION:  He was medically evaluated in the [facility] emergency room.  This is a normally developed Caucasian male, compact build weighs 85 kg 5 feet 4 inches tall.  Admitting vital signs temperature 97, three pulse 71, respirations 18, blood pressure 153/107. Full physical exam and review of systems is done in the emergency room as were diagnostic studies.  Alcohol level 32 mg/dL on presentation. Electrolytes normal.  Random glucose 209.  Renal function normal. Normal CBC and  urine drug screen positive for cocaine and cannabis metabolites.  Chronic medical problems include diabetes mellitus type 2 and hypertension.  COURSE OF HOSPITALIZATION:  Bili was admitted to our dual diagnosis unit and was continued on his routine oral medications for diabetes and received regular consults from the diabetes nurse.  We continued his routine home medications and started on Risperdal 1 mg q.h.s. and 1 mg q.8 h p.r.n. for agitation.  He was also restarted on lisinopril and metformin which he had taken at home.  RPR was found to be negative. TSH normal at 0.656.  Fasting lipid profile revealed elevated triglycerides at 293 mg/dL.  Total cholesterol of 126 and LDL cholesterol of 45.  Hemoglobin A1c 7.9 on September 3.  He had also been started back on 20 mg of Celexa daily which he had previously taken.  On initial presentation, he endorsed hearing voices, telling him to kill himself because he is worthless.  He felt that the Risperdal was helping him.  He had been frustrated at home because he tried to get into [place] residential treatment services but was not a resident of [facility], so was ineligible.  By the 27th he was  still complaining of suicidal thoughts voices and some paranoia.  He was isolating in his room feeling very overwhelmed in group settings.  He did not require restraint and was cooperative with care throughout his stay.  By the 28th, he began attending group meetings reported that he was feeling much better and not as paranoid, though he still felt depressed about the death of his wife the previous year.  He spoke with our case manager about [employer], and an ACT team were discussed as an option.  He did not really want to return to his mother's home as he felt it was not a good option, and he became more depressed there.  We added Lantus 10 mg of insulin to q.h.s. to his daily regimen to help control is a CBGs and increased his regular dose of  Risperdal to 1 mg p.o. b.i.d.  Ultimately, he was accepted at [facility], and he felt positive about going there to their 14-day program.  He was in agreement with that plan.  By October 2, he was denying any suicidal or homicidal thoughts and gauged to be of minimal suicide.  DISCHARGE/PLAN:  Follow up with [facility] program in [place] on October 2 at 3:30 p.m.  DISCHARGE DIAGNOSIS:  AXIS I: Major depression, recurrent, severe with psychotic features.  Polysubstance abuse. AXIS II: Deferred. AXIS III: Diabetes mellitus type 2, stable. New on Lantus insulin. AXIS IV: Severe grief and loss issues after wife's death. AXIS V: Current 50, past year not known.  DISCHARGE MEDICATIONS: 1. Citalopram 20 mg daily. 2. Lantus insulin 10 units q.h.s. 3. Metformin 1000 mg b.i.d. with meals. 4. Risperidone 1 mg b.i.d. and h.s. 5. Lisinopril 20 mg daily.     Margaret A. Lorin Picket, N.P.   ______________________________ Orson Aloe, MD    MAS/MEDQ  D:  04/15/2011  T:  04/15/2011  Job:  [id number]  Electronically Signed by Kari Baars N.P. on 04/19/2011 02:39:41 PM Electronically Signed by Orson Aloe  on 04/21/2011 11:37:50 AM

## 2011-04-21 NOTE — Assessment & Plan Note (Signed)
NAMECLAUDIS, GIOVANELLI NO.:  [id number]  MEDICAL RECORD NO.:  [id number]  LOCATION:  0304                          FACILITY:  [facility]  PHYSICIAN:  Orson Aloe, MD       DATE OF BIRTH:  May 14, 1966  DATE OF ADMISSION:  04/07/2011 DATE OF DISCHARGE:                      PSYCHIATRIC ADMISSION ASSESSMENT   TIME:  9:10 a.m.  IDENTIFYING INFORMATION:  A 45 year old male, widowed.  This is a voluntary admission.  HISTORY OF PRESENT ILLNESS:  Dianna presents by way of law enforcement, who came after he contacted them and indicated that he was agitated with intent to harm himself.  He also reported to them that he was very angry with his sister-in-law, who he says is selling opiates to children.  Law enforcement arrived to find him attempting to hang himself.  He says that he had a rope around his neck or a wire (it is unclear), but had not yet hung himself.  He also says he is stressed by an 35 year old daughter that does not care about him.  He reports that he has been able to stay away from drugs and alcohol for the most part since his discharge from [facility] on March 16, 2011, but did use some alcohol and cocaine yesterday prior to this episode.  He has had a lot of deep-seated anger towards his sister-in-law and it is not clear if he is homicidal towards her.  He reports his biggest problem is that he is very depressed, hearing voices in his mind that tell him that he is no good, that the people around him would be better off without him, and he should just go ahead and kill himself.  He reports ongoing grief since the death of his wife in 01/14/2011at the age of 20 from complications of breast cancer.  PAST PSYCHIATRIC HISTORY:  Previous admission to [facility] March 06, 2011, through March 16, 2011, for polysubstance abuse and depression and was treated with Celexa 30 mg daily at that time and Thorazine low dose p.r.n. for auditory  hallucinations.  He has diabetes controlled with oral agents and had not been attending to his medical needs and we restarted his diabetes medications.  He attempted to get in to [facility] residential treatment and was turned away because he did not meet county residency requirements.  He endorses ongoing problems with thought agitation, auditory hallucinations, inability to organize his thoughts or speak fluently, anhedonia alternating with agitation, sleep limited to 1 to 2 hours at a time, non-restful with frequent restless awakenings.  No current outpatient treatment.  SOCIAL HISTORY:  This is a widowed 45 year old male.  He has one 31 year old daughter that is living with her cousin and he apparently has little contact with her.  He has no legal charges, is asking for help getting placed in residential treatment.  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  Endorses ongoing grief and depression over the death of his wife.  He had been previously living with his mother, but can no longer go back there.  FAMILY HISTORY:  Significant for father with a history of alcoholism and a brother who died  of a methadone overdose in May 2011.  MEDICAL HISTORY:  Primary care provider is the [facility], where he has an appointment pending.  Medical problems are hypertension, diabetes mellitus type 2, controlled with oral agents, stable.  CURRENT MEDICATIONS: 1. Metformin 500 mg b.i.d. 2. Benicar 20 mg daily. 3. Trazodone 50 mg h.s. p.r.n. insomnia. 4. Lisinopril 20 mg daily. 5. Hydrochlorothiazide 12.5 mg daily. 6. Citalopram 30 mg daily. 7. Thorazine 25 mg q.4 hours p.r.n. for agitation.  DRUG ALLERGIES:  None.  PHYSICAL EXAMINATION:  Physical exam was done in the emergency room and is noted in the record.  This is a well-nourished, well-developed male, compact build.  He weighs 85 kg, 5 feet 4 inches tall.  Admitting vital signs:   Temperature 97.3, pulse 71, respirations 18, blood pressure 153/107.  Diagnostic studies were done in the emergency room.  Alcohol level 32 mg/dL.  Random glucose 209.  Electrolytes are normal.  Renal function is normal.  Normal CBC.  Urine drug screen positive for cocaine and cannabis metabolites.  MENTAL STATUS EXAMINATION:  A fully alert male, appears moderately anxious with moist skin.  Endorses feeling mild agitation.  He has been taken out of group therapy for a consult session and is having difficulty organizing his thoughts, speaking.  Reports having auditory hallucinations with voices telling him that he should just go ahead and hurt himself, that there is no point in living, people would be better off without him.  Mood is depressed and anxious.  Thinking logical and coherent.  He has some response latency.  Speech is a bit halting, but otherwise normal in form.  He recognizes his speech is not fluent. Asking for help with his depression, agitation. And hallucinations. Memory is intact.  DIAGNOSES:  Axis I:  Major depression, recurrent, severe with psychotic features.  Polysubstance abuse. Axis II:  Deferred. Axis III:  Diabetes mellitus type 2, stable on oral agents, hypertension. Axis IV:  Severe ongoing grief and loss issues, family conflict, essentially homeless. Axis V:  Current 38, past year not known.  PLAN:  To voluntarily admit him with a goal of alleviating his suicidal thoughts and his auditory hallucinations.  We are going to start him on Ativan 1 mg p.o. now and Risperdal 1 mg p.o. now, then follow with Risperdal 1 mg p.o. q.h.s., Ativan 1 mg q.6 hours p.r.n. for agitation or anxiety, and Risperdal 1 mg q.8 hours p.r.n. for agitation.  To control his blood pressure, we will start him on lisinopril 20 mg daily.  Then as we get him stabilized, we are going to look into outpatient residential treatment for his substance abuse.  He is in agreement with the  plan.     Margaret A. Lorin Picket, N.P.   ______________________________ Orson Aloe, MD    MAS/MEDQ  D:  04/07/2011  T:  04/07/2011  Job:  [id number]  Electronically Signed by Kari Baars N.P. on 04/07/2011 05:09:03 PM Electronically Signed by Orson Aloe  on 04/21/2011 11:37:41 AM

## 2011-10-16 ENCOUNTER — Encounter (HOSPITAL_COMMUNITY): Payer: Self-pay | Admitting: *Deleted

## 2011-10-16 ENCOUNTER — Emergency Department (HOSPITAL_COMMUNITY)
Admission: EM | Admit: 2011-10-16 | Discharge: 2011-10-19 | Disposition: A | Discharge status: Home / Self Care | Payer: Self-pay | Attending: Emergency Medicine | Admitting: Emergency Medicine

## 2011-10-16 DIAGNOSIS — F329 Major depressive disorder, single episode, unspecified: Secondary | ICD-10-CM

## 2011-10-16 DIAGNOSIS — F1910 Other psychoactive substance abuse, uncomplicated: Secondary | ICD-10-CM | POA: Insufficient documentation

## 2011-10-16 DIAGNOSIS — F419 Anxiety disorder, unspecified: Secondary | ICD-10-CM

## 2011-10-16 DIAGNOSIS — F411 Generalized anxiety disorder: Secondary | ICD-10-CM | POA: Insufficient documentation

## 2011-10-16 DIAGNOSIS — I10 Essential (primary) hypertension: Secondary | ICD-10-CM | POA: Insufficient documentation

## 2011-10-16 DIAGNOSIS — R45851 Suicidal ideations: Secondary | ICD-10-CM | POA: Insufficient documentation

## 2011-10-16 DIAGNOSIS — E119 Type 2 diabetes mellitus without complications: Secondary | ICD-10-CM | POA: Insufficient documentation

## 2011-10-16 LAB — RAPID URINE DRUG SCREEN, HOSP PERFORMED
Barbiturates: NOT DETECTED
Benzodiazepines: NOT DETECTED

## 2011-10-16 LAB — CBC
HCT: 46.1 % (ref 39.0–52.0)
Hemoglobin: 17 g/dL (ref 13.0–17.0)
RBC: 5.28 MIL/uL (ref 4.22–5.81)
RDW: 12.8 % (ref 11.5–15.5)
WBC: 7.7 10*3/uL (ref 4.0–10.5)

## 2011-10-16 LAB — COMPREHENSIVE METABOLIC PANEL
ALT: 43 U/L (ref 0–53)
AST: 32 U/L (ref 0–37)
CO2: 23 mEq/L (ref 19–32)
Chloride: 96 mEq/L (ref 96–112)
Creatinine, Ser: 0.82 mg/dL (ref 0.50–1.35)
GFR calc non Af Amer: >90 mL/min (ref >90)
Total Bilirubin: 0.3 mg/dL (ref 0.3–1.2)

## 2011-10-16 LAB — DIFFERENTIAL
Basophils Absolute: 0.1 10*3/uL (ref 0.0–0.1)
Lymphocytes Relative: 30 % (ref 12–46)
Monocytes Absolute: 0.4 10*3/uL (ref 0.1–1.0)
Neutro Abs: 4.9 10*3/uL (ref 1.7–7.7)

## 2011-10-16 LAB — URINE MICROSCOPIC-ADD ON

## 2011-10-16 LAB — URINALYSIS, ROUTINE W REFLEX MICROSCOPIC
Bilirubin Urine: NEGATIVE
Ketones, ur: 15 mg/dL — AB
Nitrite: NEGATIVE
Protein, ur: 100 mg/dL — AB
Urobilinogen, UA: 0.2 mg/dL (ref 0.0–1.0)

## 2011-10-16 MED ORDER — LORAZEPAM 1 MG PO TABS
0.0000 mg | ORAL_TABLET | Freq: Four times a day (QID) | ORAL | Status: AC
  Administered 2011-10-17: 2 mg via ORAL
  Administered 2011-10-18: 1 mg via ORAL

## 2011-10-16 MED ORDER — THIAMINE HCL 100 MG/ML IJ SOLN: 100.0000 mg | Freq: Every day | INTRAMUSCULAR | Status: DC

## 2011-10-16 MED ORDER — LORAZEPAM 1 MG PO TABS
1.0000 mg | ORAL_TABLET | Freq: Four times a day (QID) | ORAL | Status: DC | PRN
  Administered 2011-10-17: 1 mg via ORAL
  Filled 2011-10-16: qty 2
  Filled 2011-10-16 (×4): qty 1

## 2011-10-16 MED ORDER — ALUM & MAG HYDROXIDE-SIMETH 200-200-20 MG/5ML PO SUSP: 30.0000 mL | ORAL | Status: DC | PRN

## 2011-10-16 MED ORDER — LORAZEPAM 1 MG PO TABS
1.0000 mg | ORAL_TABLET | Freq: Three times a day (TID) | ORAL | Status: DC | PRN
  Administered 2011-10-16 – 2011-10-17 (×2): 1 mg via ORAL
  Filled 2011-10-16 (×2): qty 1

## 2011-10-16 MED ORDER — IBUPROFEN 200 MG PO TABS: 600.0000 mg | ORAL_TABLET | Freq: Three times a day (TID) | ORAL | Status: DC | PRN

## 2011-10-16 MED ORDER — VITAMIN B-1 100 MG PO TABS
100.0000 mg | ORAL_TABLET | Freq: Every day | ORAL | Status: DC
  Administered 2011-10-17 – 2011-10-18 (×3): 100 mg via ORAL
  Filled 2011-10-16 (×5): qty 1

## 2011-10-16 MED ORDER — ONDANSETRON HCL 8 MG PO TABS: 4.0000 mg | ORAL_TABLET | Freq: Three times a day (TID) | ORAL | Status: DC | PRN

## 2011-10-16 MED ORDER — ADULT MULTIVITAMIN W/MINERALS CH
1.0000 | ORAL_TABLET | Freq: Every day | ORAL | Status: DC
  Administered 2011-10-17 – 2011-10-18 (×3): 1 via ORAL
  Filled 2011-10-16 (×5): qty 1

## 2011-10-16 MED ORDER — LORAZEPAM 1 MG PO TABS: 0.0000 mg | ORAL_TABLET | Freq: Two times a day (BID) | ORAL | Status: DC

## 2011-10-16 MED ORDER — FOLIC ACID 1 MG PO TABS
1.0000 mg | ORAL_TABLET | Freq: Every day | ORAL | Status: DC
  Administered 2011-10-17 – 2011-10-18 (×3): 1 mg via ORAL
  Filled 2011-10-16 (×5): qty 1

## 2011-10-16 MED ORDER — LORAZEPAM 2 MG/ML IJ SOLN: 1.0000 mg | Freq: Four times a day (QID) | INTRAMUSCULAR | Status: DC | PRN

## 2011-10-16 MED ORDER — NICOTINE 21 MG/24HR TD PT24
21.0000 mg | MEDICATED_PATCH | Freq: Every day | TRANSDERMAL | Status: DC
  Administered 2011-10-16 – 2011-10-18 (×2): 21 mg via TRANSDERMAL
  Filled 2011-10-16 (×4): qty 1

## 2011-10-16 NOTE — ED Notes (Signed)
The pt placed in scrubs his clothes have been placed in  A valuables bag and labeled.  Chg rn notified and house coverage.  .  Pt  wanded by security

## 2011-10-16 NOTE — ED Notes (Signed)
He has not had his bp or diabetes med in a month

## 2011-10-16 NOTE — ED Notes (Signed)
Pt reports suicidal ideations x3 days - pt admits to previous psych hx w/ substance abuse and SI - pt states he has been this way ever since his wife passed away x2 years ago. Daughter at bedside states pt needs longer inpatient care. Pt calm, pleasant and cooperative on assessment - admits to a plan of hanging himself however does not want to follow through on his plan as not to upset his daughter. Pt also admits to heavy alcohol use approx 4-5 24oz beers daily and cocaine use.

## 2011-10-16 NOTE — ED Notes (Addendum)
Last alcohol today cocaine last used last pm

## 2011-10-16 NOTE — ED Notes (Signed)
The pt says he is having suicidal thoughts for the past several days

## 2011-10-16 NOTE — BH Assessment (Signed)
Assessment Note   [name] is an 46 y.o. male. Pt reports with ETOH/SA.  Pt. Presents with SI ideations, no clear plan. Pt. Reports serious past depression and grief symptoms since passing of his spouse two years ago. Pt. Reports he has lost interest in normal  Activities, spends most of his time using cocaine and alcohol, isolates self and feels depressed. Pt. Reports no significant appetite, loss of weight, decreased grooming and tearfulness.  Pt. Reports he drinks a six pack a day and may snort a gram a day.  Pt reports he feels hopeless and worthless.  Pt reported he had medication for depression but has no idea how long it has been since he took medication.    Axis I: Alcohol Abuse, Depressive Disorder NOS and Substance Abuse Axis II: deferred Axis III: hypertension, diabetes Axis IV:  Grief Axis V:  GAF=11-20  Past Medical History:  Past Medical History  Diagnosis Date  . Hypertension   . Diabetes mellitus     Past Surgical History  Procedure Date  . Neck surgery     Family History:  Family History  Problem Relation Age of Onset  . Diabetes Brother     Social History:  reports that he has been smoking.  He does not have any smokeless tobacco history on file. He reports that he drinks alcohol. He reports that he uses illicit drugs.  Additional Social History:    Allergies: No Known Allergies  Home Medications:  Medications Prior to Admission  Medication Dose Route Frequency Provider Last Rate Last Dose  . alum & mag hydroxide-simeth (MAALOX/MYLANTA) 200-200-20 MG/5ML suspension 30 mL  30 mL Oral PRN Grant Fontana, PA      . folic acid (FOLVITE) tablet 1 mg  1 mg Oral Daily Grant Fontana, [place]      . ibuprofen (ADVIL,MOTRIN) tablet 600 mg  600 mg Oral Q8H PRN Grant Fontana, [place]      . LORazepam (ATIVAN) tablet 1 mg  1 mg Oral Q6H PRN Grant Fontana, PA       Or  . LORazepam (ATIVAN) injection 1 mg  1 mg Intravenous Q6H PRN Grant Fontana, PA       . LORazepam (ATIVAN) tablet 0-4 mg  0-4 mg Oral Q6H Grant Fontana, PA       Followed by  . LORazepam (ATIVAN) tablet 0-4 mg  0-4 mg Oral Q12H Grant Fontana, [place]      . LORazepam (ATIVAN) tablet 1 mg  1 mg Oral Q8H PRN Grant Fontana, PA   1 mg at 10/16/11 2154  . mulitivitamin with minerals tablet 1 tablet  1 tablet Oral Daily Grant Fontana, [place]      . nicotine (NICODERM CQ - dosed in mg/24 hours) patch 21 mg  21 mg Transdermal Daily Grant Fontana, [place]   21 mg at 10/16/11 2154  . ondansetron (ZOFRAN) tablet 4 mg  4 mg Oral Q8H PRN Grant Fontana, PA      . thiamine (VITAMIN B-1) tablet 100 mg  100 mg Oral Daily Grant Fontana, [place]       Or  . thiamine (B-1) injection 100 mg  100 mg Intravenous Daily Grant Fontana, [place]       No current outpatient prescriptions on file as of 10/16/2011.    OB/GYN Status:  No LMP for male patient.  General Assessment Data Location of Assessment: [facility] ED ACT Assessment: Yes Living Arrangements: Alone Can pt return to current living arrangement?: Yes Admission Status: Voluntary  Is patient capable of signing voluntary admission?: Yes Transfer from: Acute Hospital Referral Source: Self/Family/Friend  Education Status Is patient currently in school?: No  Risk to self Suicidal Ideation: Yes-Currently Present Suicidal Intent: Yes-Currently Present Is patient at risk for suicide?: Yes Suicidal Plan?: Yes-Currently Present Specify Current Suicidal Plan: unsure Access to Means: Yes Specify Access to Suicidal Means: pills, hang self What has been your use of drugs/alcohol within the last 12 months?: ETOH, cocaine dail Previous Attempts/Gestures: Yes How many times?: 1  Other Self Harm Risks: drinking/drugs Triggers for Past Attempts: Unpredictable Intentional Self Injurious Behavior: None Family Suicide History: Unknown Recent stressful life event(s): Loss (Comment) Persecutory voices/beliefs?: No Depression:  Yes Depression Symptoms: Insomnia;Tearfulness;Isolating;Guilt;Loss of interest in usual pleasures;Feeling worthless/self pity Substance abuse history and/or treatment for substance abuse?: Yes Suicide prevention information given to non-admitted patients: Not applicable  Risk to Others Homicidal Ideation: No Thoughts of Harm to Others: No Current Homicidal Intent: No Current Homicidal Plan: No Access to Homicidal Means: No History of harm to others?: No Assessment of Violence: In past 6-12 months Does patient have access to weapons?: No Criminal Charges Pending?: No Does patient have a court date: No  Psychosis Hallucinations: None noted Delusions: None noted  Mental Status Report Appear/Hygiene: Disheveled Eye Contact: Good Motor Activity: Freedom of movement Speech: Logical/coherent Level of Consciousness: Drowsy Mood: Depressed Affect: Depressed Anxiety Level: Minimal Thought Processes: Coherent;Relevant Judgement: Impaired Orientation: Person;Place;Time;Situation Obsessive Compulsive Thoughts/Behaviors: None  Cognitive Functioning Concentration: Normal Memory: Recent Intact;Remote Intact IQ: Average Insight: Poor Impulse Control: Poor Appetite: Poor Weight Loss: 10  Weight Gain: 0  Sleep: Decreased Total Hours of Sleep: 5  Vegetative Symptoms: Staying in bed;Decreased grooming  Prior Inpatient Therapy Prior Inpatient Therapy: Yes Prior Therapy Dates: 2011 Prior Therapy Facilty/Provider(s): [facility] Reason for Treatment: Detox  Prior Outpatient Therapy Prior Outpatient Therapy: No                     Additional Information 1:1 In Past 12 Months?: No CIRT Risk: No Elopement Risk: No Does patient have medical clearance?: Yes     Disposition: Pt referred to [facility] pending disposition.   Disposition Disposition of Patient: Inpatient treatment program Type of inpatient treatment program: Adult  On Site Evaluation by:   Reviewed with  Physician:     Barbaraann Boys 10/16/2011 11:41 PM

## 2011-10-16 NOTE — ED Provider Notes (Signed)
History     CSN: [id number]  Arrival date & time 10/16/11  2005   First MD Initiated Contact with Patient 10/16/11 2124      Chief Complaint  Patient presents with  . Suicidal    (Consider location/radiation/quality/duration/timing/severity/associated sxs/prior treatment) Patient is a 46 y.o. male presenting with mental health disorder. The history is provided by the patient.  Mental Health Problem The primary symptoms include dysphoric mood. The primary symptoms do not include hallucinations. The current episode started more than 1 month ago. This is a chronic problem.  The dysphoric mood began more than 2 weeks ago. The mood has been unchanged since its onset. He characterizes the problem as moderate. The mood includes feelings of sadness (anxiety).  The onset of the illness is precipitated by drug abuse. The degree of incapacity that he is experiencing as a consequence of his illness is moderate. Additional symptoms of the illness do not include no appetite change or no abdominal pain. He admits to suicidal ideas. He does have a plan to commit suicide. He contemplates harming himself. He has already injured self. He does not contemplate injuring another person. He has not already  injured another person. Risk factors that are present for mental illness include substance abuse.   Pt presents for medical screening. Hx DM, HTN. States he has had suicidal thoughts for the past 2 years since his wife died which have worsened since yesterday. He denies any events that have exacerbated his symptoms. States "I thought about hanging myself but couldn't find a rope." Denies HI. He has been evaluated at [facility] in the past - it was recommended that he pursue outpatient treatment, which he has not been able to coordinate. Does have hx of substance abuse (cocaine and etoh) - last cocaine use 3-4 days ago.  Past Medical History  Diagnosis Date  . Hypertension   . Diabetes mellitus     Past Surgical  History  Procedure Date  . Neck surgery     Family History  Problem Relation Age of Onset  . Diabetes Brother     History  Substance Use Topics  . Smoking status: Current Everyday Smoker  . Smokeless tobacco: Not on file  . Alcohol Use: Yes      Review of Systems  Constitutional: Negative for fever, chills, activity change and appetite change.  HENT: Negative for congestion, sore throat, mouth sores and neck pain.   Eyes: Negative for visual disturbance.  Respiratory: Negative for cough, chest tightness and shortness of breath.   Cardiovascular: Negative for chest pain.  Gastrointestinal: Negative for nausea, vomiting and abdominal pain.  Musculoskeletal: Negative for myalgias.  Skin: Negative for color change and rash.  Neurological: Negative for dizziness and weakness.  Psychiatric/Behavioral: Positive for suicidal ideas and dysphoric mood. Negative for hallucinations, behavioral problems, sleep disturbance and self-injury. The patient is nervous/anxious.     Allergies  Review of patient's allergies indicates no known allergies.  Home Medications  No current outpatient prescriptions on file.  BP 164/101  Pulse 114  Temp(Src) 98.3 F (36.8 C) (Oral)  Resp 18  SpO2 98%  Physical Exam  Nursing note and vitals reviewed. Constitutional: He is oriented to person, place, and time. He appears well-developed and well-nourished. No distress.  HENT:  Head: Normocephalic and atraumatic.  Right Ear: External ear normal.  Mouth/Throat: Oropharynx is clear and moist. No oropharyngeal exudate.  Eyes: EOM are normal. Pupils are equal, round, and reactive to light.  Neck: Normal range of  motion. Neck supple.  Cardiovascular: Normal rate, regular rhythm and normal heart sounds.   Pulmonary/Chest: Effort normal and breath sounds normal. He exhibits no tenderness.  Abdominal: Soft. Bowel sounds are normal. There is no tenderness.  Musculoskeletal: Normal range of motion.    Lymphadenopathy:    He has no cervical adenopathy.  Neurological: He is alert and oriented to person, place, and time. No cranial nerve deficit.  Skin: Skin is warm and dry. He is not diaphoretic.  Psychiatric: He has a normal mood and affect.    ED Course  Procedures (including critical care time)  Labs Reviewed  URINALYSIS, ROUTINE W REFLEX MICROSCOPIC - Abnormal; Notable for the following:    Glucose, UA >1000 (*)    Hgb urine dipstick SMALL (*)    Ketones, ur 15 (*)    Protein, ur 100 (*)    All other components within normal limits  URINE RAPID DRUG SCREEN (HOSP PERFORMED) - Abnormal; Notable for the following:    Tetrahydrocannabinol POSITIVE (*)    All other components within normal limits  CBC - Abnormal; Notable for the following:    MCHC 36.9 (*)    Platelets 133 (*)    All other components within normal limits  COMPREHENSIVE METABOLIC PANEL - Abnormal; Notable for the following:    Glucose, Bld 278 (*)    All other components within normal limits  ETHANOL - Abnormal; Notable for the following:    Alcohol, Ethyl (B) 118 (*)    All other components within normal limits  DIFFERENTIAL  URINE MICROSCOPIC-ADD ON   No results found.   No diagnosis found.    MDM  Pt expressing suicidal ideation. Medically clear for psych eval. D/W ACT at 2245 who will see pt and make recs. Pt will need new rxes for his home meds at dc.        Grant Fontana, [place] 10/16/11 2325

## 2011-10-17 LAB — GLUCOSE, CAPILLARY
Glucose-Capillary: 268 mg/dL — ABNORMAL HIGH (ref 70–99)
Glucose-Capillary: 285 mg/dL — ABNORMAL HIGH (ref 70–99)
Glucose-Capillary: 313 mg/dL — ABNORMAL HIGH (ref 70–99)

## 2011-10-17 MED ORDER — INSULIN ASPART 100 UNIT/ML ~~LOC~~ SOLN
5.0000 [IU] | Freq: Two times a day (BID) | SUBCUTANEOUS | Status: DC
  Administered 2011-10-17 – 2011-10-18 (×3): 5 [IU] via SUBCUTANEOUS
  Filled 2011-10-17 (×2): qty 1

## 2011-10-17 MED ORDER — INSULIN GLARGINE 100 UNIT/ML ~~LOC~~ SOLN
10.0000 [IU] | Freq: Every day | SUBCUTANEOUS | Status: DC
  Administered 2011-10-17 – 2011-10-18 (×2): 10 [IU] via SUBCUTANEOUS
  Filled 2011-10-17 (×2): qty 1

## 2011-10-17 MED ORDER — METFORMIN HCL 500 MG PO TABS
500.0000 mg | ORAL_TABLET | Freq: Two times a day (BID) | ORAL | Status: DC
  Administered 2011-10-17 – 2011-10-19 (×4): 500 mg via ORAL
  Filled 2011-10-17 (×4): qty 1

## 2011-10-17 MED ORDER — METFORMIN HCL 500 MG PO TABS
500.0000 mg | ORAL_TABLET | ORAL | Status: DC
  Filled 2011-10-17 (×2): qty 1

## 2011-10-17 MED ORDER — LORAZEPAM 1 MG PO TABS
1.0000 mg | ORAL_TABLET | ORAL | Status: DC | PRN
  Administered 2011-10-18 (×3): 1 mg via ORAL
  Filled 2011-10-17 (×2): qty 1

## 2011-10-17 NOTE — ED Notes (Signed)
Taken to 4100 for bath with sitter and security

## 2011-10-17 NOTE — ED Notes (Signed)
Pt complaining of feeling anxious. Pt wants to get something to help him sleep. Pt resting at this time. Pt cooperative, sitter at bedside.

## 2011-10-17 NOTE — BH Assessment (Addendum)
Assessment Note   Discussed Robyne Askew with Verne Spurr, PA @ 11:22.  She was concerned about his vital signs and his CBG results, and also felt that he was too clinically acute to benefit from treatment at [facility].  She declined admission to [facility], and recommended placement at another facility.  Called Robinette Haines, ACT Team and informed him.  Raphael Gibney 10/17/2011 11:45 AM

## 2011-10-17 NOTE — [facility] Counselor (Signed)
Denied admission to [facility] due to SI within the last 24 hours.

## 2011-10-17 NOTE — BH Assessment (Signed)
Assessment Note   [name] is an 46 y.o. male who is seeking treatment for his substance abuse use.  He reports that his drug of use is powered cocaine and alcohol.  His frequency of use has been daily with both substances.  He further stated that he started use approximately two years ago and it was due to the passing of his wife.  He also reports of having auditory hallucinations and the voices are telling him to kill himself and endorses SI with a plan. He denies any HI or visual hallucinations.  His natural supports includes his daughter and other family members and he stated that they all get alone well.    Axis I: Alcohol Abuse, Depressive Disorder NOS and Substance Abuse  Axis II: deferred  Axis III: hypertension, diabetes  Axis IV: Grief  Axis V: GAF=11-20   Past Medical History:  Past Medical History  Diagnosis Date  . Hypertension   . Diabetes mellitus     Past Surgical History  Procedure Date  . Neck surgery     Family History:  Family History  Problem Relation Age of Onset  . Diabetes Brother     Social History:  reports that he has been smoking.  He does not have any smokeless tobacco history on file. He reports that he drinks alcohol. He reports that he uses illicit drugs.  Additional Social History:    Allergies: No Known Allergies  Home Medications:  Medications Prior to Admission  Medication Dose Route Frequency Provider Last Rate Last Dose  . alum & mag hydroxide-simeth (MAALOX/MYLANTA) 200-200-20 MG/5ML suspension 30 mL  30 mL Oral PRN Grant Fontana, PA      . folic acid (FOLVITE) tablet 1 mg  1 mg Oral Daily Grant Fontana, [place]   1 mg at 10/17/11 0959  . ibuprofen (ADVIL,MOTRIN) tablet 600 mg  600 mg Oral Q8H PRN Grant Fontana, [place]      . LORazepam (ATIVAN) tablet 1 mg  1 mg Oral Q6H PRN Grant Fontana, PA       Or  . LORazepam (ATIVAN) injection 1 mg  1 mg Intravenous Q6H PRN Grant Fontana, PA      . LORazepam (ATIVAN)  tablet 0-4 mg  0-4 mg Oral Q6H Grant Fontana, [place]   2 mg at 10/17/11 0207   Followed by  . LORazepam (ATIVAN) tablet 0-4 mg  0-4 mg Oral Q12H Grant Fontana, [place]      . LORazepam (ATIVAN) tablet 1 mg  1 mg Oral Q8H PRN Grant Fontana, PA   1 mg at 10/16/11 2154  . mulitivitamin with minerals tablet 1 tablet  1 tablet Oral Daily Grant Fontana, [place]   1 tablet at 10/17/11 0028  . nicotine (NICODERM CQ - dosed in mg/24 hours) patch 21 mg  21 mg Transdermal Daily Grant Fontana, [place]   21 mg at 10/16/11 2154  . ondansetron (ZOFRAN) tablet 4 mg  4 mg Oral Q8H PRN Grant Fontana, PA      . thiamine (VITAMIN B-1) tablet 100 mg  100 mg Oral Daily Grant Fontana, [place]   100 mg at 10/17/11 5784   Or  . thiamine (B-1) injection 100 mg  100 mg Intravenous Daily Grant Fontana, [place]       No current outpatient prescriptions on file as of 10/16/2011.    OB/GYN Status:  No LMP for male patient.  General Assessment Data Location of Assessment: [facility] ED ACT Assessment: Yes Living Arrangements: Alone Can pt  return to current living arrangement?: Yes Admission Status: Voluntary Is patient capable of signing voluntary admission?: Yes Transfer from: Acute Hospital Referral Source: Self/Family/Friend  Education Status Is patient currently in school?: No  Risk to self Suicidal Ideation: Yes-Currently Present Suicidal Intent: Yes-Currently Present Is patient at risk for suicide?: Yes Suicidal Plan?: Yes-Currently Present Specify Current Suicidal Plan: Walk in front of a car Access to Means: Yes Specify Access to Suicidal Means: Walk in front of a car, pills or hang self What has been your use of drugs/alcohol within the last 12 months?: Etho, cocaine Previous Attempts/Gestures: Yes How many times?: 1  Other Self Harm Risks: Drinking and drugs Triggers for Past Attempts: Unpredictable Intentional Self Injurious Behavior: None Family Suicide History: Unknown Recent stressful life  event(s): Loss (Comment) (Wife passed ) Persecutory voices/beliefs?: No Depression: Yes Depression Symptoms: Insomnia;Tearfulness;Isolating;Fatigue;Feeling worthless/self pity;Loss of interest in usual pleasures Substance abuse history and/or treatment for substance abuse?: Yes Suicide prevention information given to non-admitted patients: Not applicable  Risk to Others Homicidal Ideation: No Thoughts of Harm to Others: No Current Homicidal Intent: No Current Homicidal Plan: No Access to Homicidal Means: No Identified Victim: None noted History of harm to others?: No Assessment of Violence: In past 6-12 months Violent Behavior Description: None noted Does patient have access to weapons?: No Criminal Charges Pending?: No Does patient have a court date: No  Psychosis Hallucinations: Auditory Delusions: None noted  Mental Status Report Appear/Hygiene: Disheveled Eye Contact: Good Motor Activity: Freedom of movement Speech: Logical/coherent Level of Consciousness: Alert;Quiet/awake Mood: Depressed Affect: Depressed Anxiety Level: Minimal Thought Processes: Coherent;Relevant Judgement: Unimpaired Orientation: Person;Place;Time;Situation Obsessive Compulsive Thoughts/Behaviors: None  Cognitive Functioning Concentration: Normal Memory: Recent Intact;Remote Intact IQ: Average Insight: Poor Impulse Control: Poor Appetite: Poor Weight Loss: 10  Weight Gain: 0  Sleep: Decreased Total Hours of Sleep: 5  Vegetative Symptoms: None  Prior Inpatient Therapy Prior Inpatient Therapy: Yes Prior Therapy Dates: 2011, 2013 Prior Therapy Facilty/Provider(s): [facility] Reason for Treatment: Detox, Substance abuse  Prior Outpatient Therapy Prior Outpatient Therapy: No                     Additional Information 1:1 In Past 12 Months?: No CIRT Risk: No Elopement Risk: No Does patient have medical clearance?: Yes     Disposition:  Disposition Disposition of  Patient: Inpatient treatment program Type of inpatient treatment program: Adult  On Site Evaluation by:   Reviewed with Physician:     Morley Kos 10/17/2011 10:30 AM

## 2011-10-17 NOTE — ED Notes (Signed)
Contact infoGerri Adams [phone] Patients Daughter

## 2011-10-17 NOTE — ED Provider Notes (Signed)
Medical screening examination/treatment/procedure(s) were performed by non-physician practitioner and as supervising physician I was immediately available for consultation/collaboration.  Doug Sou, MD 10/17/11 [phone]

## 2011-10-17 NOTE — [facility] Counselor (Signed)
Pt. Was informed that he was being under review with [facility].  He stated that he didn't want to go to [facility].  Counselor called [facility] to let them know to take his name of the review process.

## 2011-10-17 NOTE — ED Notes (Signed)
Belongings in locker 5 per prior rn, shower completed, lunch given, sitter in room

## 2011-10-17 NOTE — [facility] Counselor (Signed)
Information faxed to [facility], [place] and [place]

## 2011-10-18 LAB — GLUCOSE, CAPILLARY
Glucose-Capillary: 262 mg/dL — ABNORMAL HIGH (ref 70–99)
Glucose-Capillary: 227 mg/dL — ABNORMAL HIGH (ref 70–99)

## 2011-10-18 MED ORDER — LISINOPRIL 10 MG PO TABS
10.0000 mg | ORAL_TABLET | Freq: Every day | ORAL | Status: DC
  Administered 2011-10-18: 10 mg via ORAL
  Filled 2011-10-18 (×3): qty 1

## 2011-10-18 MED ORDER — ZOLPIDEM TARTRATE 5 MG PO TABS
10.0000 mg | ORAL_TABLET | Freq: Once | ORAL | Status: AC
  Administered 2011-10-18: 10 mg via ORAL
  Filled 2011-10-18: qty 2

## 2011-10-18 NOTE — ED Notes (Signed)
EDP made aware of pt consistant elevated BP, new orders for lisinopril started in AM.

## 2011-10-18 NOTE — ED Provider Notes (Signed)
Filed Vitals:   10/18/11 1638  BP: 122/77  Pulse: 92  Temp: 98.3 F (36.8 C)  Resp: 19    While under my care today the patient complained of hallucinations. A telepsych consult was ordered. This is pending at this time.  Cyndra Numbers, MD 10/18/11 [phone]

## 2011-10-18 NOTE — ED Notes (Signed)
CBG 227 

## 2011-10-18 NOTE — BH Assessment (Signed)
Assessment Note   [name] is an 46 y.o. male that was reassessed this day.  Pt continues to endorse SI with plan.  Pt denies HI.  Pt still reports he would like help with detox.  However, pt will not talk about his use of ETOH and cocaine.  Pt denies psychosis.  Pt declined [facility], declined [facility] due to pt acuity.  Myra Gianotti, beds available.  Received call from [facility], pt declined due to pending legal charges and court date (assault and battery charge with injuries that pt failed to tell assessment staff with pending court date).  Called [facility] and pt would be out of network for them.  Called [facility] and left page.  Pt pending telepsych.  [facility] referral will need to be initiated if pt not accepted elsewhere.  Completed reassessment, assessment notification and faxed to [facility] to log.  Pending telepsych.  Previous Note:  [name] is an 46 y.o. male. Pt reports with ETOH/SA. Pt. Presents with SI ideations, no clear plan. Pt. Reports serious past depression and grief symptoms since passing of his spouse two years ago. Pt. Reports he has lost interest in normal Activities, spends most of his time using cocaine and alcohol, isolates self and feels depressed. Pt. Reports no significant appetite, loss of weight, decreased grooming and tearfulness. Pt. Reports he drinks a six pack a day and may snort a gram a day. Pt reports he feels hopeless and worthless. Pt reported he had medication for depression but has no idea how long it has been since he took medication.    Axis I: Depressive Disorder NOS and Substance Abuse Axis II: Deferred Axis III:  Past Medical History  Diagnosis Date  . Hypertension   . Diabetes mellitus    Axis IV: problems with primary support group and grief Axis V: 21-30 behavior considerably influenced by delusions or hallucinations OR serious impairment in judgment, communication OR inability to function in almost all areas  Past Medical History:  Past Medical History    Diagnosis Date  . Hypertension   . Diabetes mellitus     Past Surgical History  Procedure Date  . Neck surgery     Family History:  Family History  Problem Relation Age of Onset  . Diabetes Brother     Social History:  reports that he has been smoking.  He does not have any smokeless tobacco history on file. He reports that he drinks alcohol. He reports that he uses illicit drugs.  Additional Social History:  Alcohol / Drug Use Pain Medications: na Prescriptions: see list Over the Counter: na History of alcohol / drug use?: Yes Substance #1 Name of Substance 1: ETOH 1 - Age of First Use: 43 1 - Amount (size/oz): unknown 1 - Frequency: daily 1 - Duration: 2 years 1 - Last Use / Amount: unknown Substance #2 Name of Substance 2: Cocaine 2 - Age of First Use: 43 2 - Amount (size/oz): unknown 2 - Frequency: daily 2 - Duration: 2 years 2 - Last Use / Amount: unknown Allergies: No Known Allergies  Home Medications:  Medications Prior to Admission  Medication Dose Route Frequency Provider Last Rate Last Dose  . alum & mag hydroxide-simeth (MAALOX/MYLANTA) 200-200-20 MG/5ML suspension 30 mL  30 mL Oral PRN Grant Fontana, PA      . folic acid (FOLVITE) tablet 1 mg  1 mg Oral Daily Grant Fontana, [place]   1 mg at 10/18/11 1049  . ibuprofen (ADVIL,MOTRIN) tablet  600 mg  600 mg Oral Q8H PRN Grant Fontana, PA      . insulin aspart (novoLOG) injection 5 Units  5 Units Subcutaneous BID Hurman Horn, MD   5 Units at 10/18/11 1033  . insulin glargine (LANTUS) injection 10 Units  10 Units Subcutaneous QHS Hurman Horn, MD   10 Units at 10/17/11 2241  . lisinopril (PRINIVIL,ZESTRIL) tablet 10 mg  10 mg Oral Daily Gwyneth Sprout, MD   10 mg at 10/18/11 1028  . LORazepam (ATIVAN) tablet 1 mg  1 mg Oral Q6H PRN Grant Fontana, PA   1 mg at 10/17/11 2034   Or  . LORazepam (ATIVAN) injection 1 mg  1 mg Intravenous Q6H PRN Grant Fontana, PA      . LORazepam (ATIVAN)  tablet 0-4 mg  0-4 mg Oral Q6H Grant Fontana, [place]   1 mg at 10/18/11 1030   Followed by  . LORazepam (ATIVAN) tablet 0-4 mg  0-4 mg Oral Q12H Grant Fontana, [place]      . LORazepam (ATIVAN) tablet 1 mg  1 mg Oral Q4H PRN Hurman Horn, MD   1 mg at 10/18/11 1501  . metFORMIN (GLUCOPHAGE) tablet 500 mg  500 mg Oral BID WC Hurman Horn, MD   500 mg at 10/18/11 1707  . mulitivitamin with minerals tablet 1 tablet  1 tablet Oral Daily Grant Fontana, [place]   1 tablet at 10/18/11 1050  . nicotine (NICODERM CQ - dosed in mg/24 hours) patch 21 mg  21 mg Transdermal Daily Grant Fontana, [place]   21 mg at 10/18/11 1028  . ondansetron (ZOFRAN) tablet 4 mg  4 mg Oral Q8H PRN Grant Fontana, PA      . thiamine (VITAMIN B-1) tablet 100 mg  100 mg Oral Daily Grant Fontana, [place]   100 mg at 10/18/11 1048   Or  . thiamine (B-1) injection 100 mg  100 mg Intravenous Daily Grant Fontana, [place]      . DISCONTD: LORazepam (ATIVAN) tablet 1 mg  1 mg Oral Q8H PRN Grant Fontana, PA   1 mg at 10/17/11 1300  . DISCONTD: metFORMIN (GLUCOPHAGE) tablet 500 mg  500 mg Oral To Minor Hurman Horn, MD       Medications Prior to Admission  Medication Sig Dispense Refill  . Aspirin-Salicylamide-Caffeine (BC HEADACHE POWDER PO) Take 1 packet by mouth daily as needed. For pain      . insulin aspart (NOVOLOG) 100 UNIT/ML injection Inject 5 Units into the skin 2 (two) times daily.      . insulin glargine (LANTUS) 100 UNIT/ML injection Inject 10 Units into the skin at bedtime.      . metFORMIN (GLUCOPHAGE) 500 MG tablet Take 500 mg by mouth 2 (two) times daily with a meal.      . oxycodone-acetaminophen (NARVOX) 10-500 MG per tablet Take 1 tablet by mouth every 12 (twelve) hours as needed. For pain      . PRESCRIPTION MEDICATION Take 1 tablet by mouth daily. Blood pressure medication        OB/GYN Status:  No LMP for male patient.  General Assessment Data Location of Assessment: [facility] ED ACT Assessment:  Yes Living Arrangements: Alone Can pt return to current living arrangement?: Yes Admission Status: Voluntary Is patient capable of signing voluntary admission?: Yes Transfer from: Acute Hospital Referral Source: Self/Family/Friend  Education Status Is patient currently in school?: No  Risk to self Suicidal Ideation: Yes-Currently Present Suicidal Intent: Yes-Currently Present  Is patient at risk for suicide?: Yes Suicidal Plan?: Yes-Currently Present Specify Current Suicidal Plan: walk in front of a car Access to Means: Yes Specify Access to Suicidal Means: walk in front of car, pills or hang self What has been your use of drugs/alcohol within the last 12 months?: daily use of ETOH, cocaine Previous Attempts/Gestures: Yes How many times?: 1  Other Self Harm Risks: risky behavior Triggers for Past Attempts: Unpredictable Intentional Self Injurious Behavior: None Family Suicide History: Unknown Recent stressful life event(s): Loss (Comment) (wife passed) Persecutory voices/beliefs?: No Depression: Yes Depression Symptoms: Insomnia;Isolating;Fatigue;Tearfulness;Despondent;Feeling worthless/self pity;Loss of interest in usual pleasures Substance abuse history and/or treatment for substance abuse?: Yes Suicide prevention information given to non-admitted patients: Not applicable  Risk to Others Homicidal Ideation: No Thoughts of Harm to Others: No Current Homicidal Intent: No Current Homicidal Plan: No Access to Homicidal Means: No Identified Victim: na History of harm to others?: No Assessment of Violence: In past 6-12 months Violent Behavior Description: pt has assault and battery charge with injuries Does patient have access to weapons?: No Criminal Charges Pending?: Yes Describe Pending Criminal Charges: assault and battery with injuries Does patient have a court date: Yes Court Date:  (unknown)  Psychosis Hallucinations: Auditory Delusions: None noted  Mental  Status Report Appear/Hygiene: Disheveled Eye Contact: Good Motor Activity: Unremarkable Speech: Logical/coherent Level of Consciousness: Alert;Quiet/awake Mood: Depressed Affect: Depressed Anxiety Level: Minimal Thought Processes: Coherent;Relevant Judgement: Impaired Orientation: Person;Place;Time;Situation Obsessive Compulsive Thoughts/Behaviors: None  Cognitive Functioning Concentration: Normal Memory: Recent Intact;Remote Intact IQ: Average Insight: Poor Impulse Control: Poor Appetite: Poor Weight Loss: 10  Weight Gain: 0  Sleep: Decreased Total Hours of Sleep: 5  Vegetative Symptoms: None  Prior Inpatient Therapy Prior Inpatient Therapy: Yes Prior Therapy Dates: 2011, 2013 Prior Therapy Facilty/Provider(s): [facility] Reason for Treatment: Detox, Substance abuse  Prior Outpatient Therapy Prior Outpatient Therapy: No Prior Therapy Dates: na Prior Therapy Facilty/Provider(s): na Reason for Treatment: na  ADL Screening (condition at time of admission) Patient's cognitive ability adequate to safely complete daily activities?: Yes Patient able to express need for assistance with ADLs?: Yes Independently performs ADLs?: Yes  Home Assistive Devices/Equipment Home Assistive Devices/Equipment: None    Abuse/Neglect Assessment (Assessment to be complete while patient is alone) Physical Abuse: Denies Verbal Abuse: Denies Sexual Abuse: Denies Exploitation of patient/patient's resources: Denies Self-Neglect: Denies Values / Beliefs Cultural Requests During Hospitalization: None Spiritual Requests During Hospitalization: None Consults Spiritual Care Consult Needed: No Social Work Consult Needed: No Merchant navy officer (For Healthcare) Advance Directive: Patient does not have advance directive;Patient would not like information    Additional Information 1:1 In Past 12 Months?: No CIRT Risk: No Elopement Risk: No Does patient have medical clearance?: Yes      Disposition:  Disposition Disposition of Patient: Referred to;Inpatient treatment program (Pending telepsych) Type of inpatient treatment program: Adult Patient referred to: Other (Comment) (Pt declined [facility], [facility], [facility];  pending telepsych)  On Site Evaluation by:   Reviewed with Physician:     Caryl Comes 10/18/2011 6:58 PM

## 2011-10-18 NOTE — ED Notes (Signed)
Pt. In room with Telepsych

## 2011-10-18 NOTE — ED Notes (Signed)
Pt sleeping, sitter at bedside

## 2011-10-18 NOTE — ED Notes (Signed)
Pt reports having racing thoughts and AH, reports he has already had these thoughts and has always had AH, states "they have just gotten worse since my wife died 2 years ago and the voices have become louder." Pt reports the voices are telling him to harm himself, pt reports SI w/o a plan

## 2011-10-18 NOTE — ED Notes (Signed)
Per the off going RN, all of the patient's paperwork is up to date, and there are no further actions we can take to facilitate the patient's placement at a mental health facility until the psych liaison initiates the next part of the admission process.  

## 2011-10-18 NOTE — ED Notes (Signed)
Family at bedside. 

## 2011-10-18 NOTE — ED Notes (Signed)
Pt resting at this time. Pt cooperative and pleasant watching TV.

## 2011-10-19 MED ORDER — ESCITALOPRAM OXALATE 10 MG PO TABS: 10.0000 mg | ORAL_TABLET | Freq: Every day | ORAL | Status: DC

## 2011-10-19 MED ORDER — LORAZEPAM 1 MG PO TABS: 1.0000 mg | ORAL_TABLET | Freq: Three times a day (TID) | ORAL | Status: AC | PRN

## 2011-10-19 NOTE — BH Assessment (Signed)
Assessment Note  Update:  Pt was seen by telepsych and discharge home with outpatient referrals.  Pt was offered referrals and refused.  Pt was discharged home by EDP Steinl.  ED staff updated.     Disposition:  Disposition Disposition of Patient: Other dispositions;Outpatient treatment (Pt discharged home, declined outpatient referrals) Type of inpatient treatment program: Adult Other disposition(s): Other (Comment) (Pt discharged home, declined outpatient referrals) Patient referred to: Other (Comment) (Pt discharged home)  On Site Evaluation by:   Reviewed with Physician:  Valene Bors, Rennis Harding 10/19/2011 3:49 PM

## 2011-10-19 NOTE — Discharge Instructions (Signed)
Take lexapro as prescribed. You may take ativan as need for anxiety - no driving for the next 6 hours or when taking ativan. Avoid alcohol and drug/cocaine abuse.  Follow up with primary care doctor, and mental health/[facility] in next couple days. Also use resource guide for follow up, both regarding mental health and substance abuse. Return to ER right away if worse, severe anxiety, worsening depression, thoughts of self harm, other concern.      RESOURCE GUIDE  Dental Problems  Patients with Medicaid: [facility] [phone] [address]                                           [phone] [address] Phone:  [phone]                                                  Phone:  [phone]  If unable to pay or uninsured, contact:  [facility] or [facility] to become qualified for the adult dental clinic.  Chronic Pain Problems Contact [facility]  [phone] Patients need to be referred by their primary care doctor.  Insufficient Money for Medicine Contact [employer]:  call "211" or [facility] ([phone].  No Primary Care Doctor Call [facility]  [phone] Other agencies that provide inexpensive medical care    [facility]  [phone]    [facility]  [phone]    [facility]  [phone]    [facility]  [phone]    [facility]  [phone]    [facility]  [phone]  Psychological Services [facility]  [phone] [facility]  [phone] [facility]   [phone] (emergency services [phone])  Substance Abuse Resources [facility]  7025491911 [facility] [phone] [facility] [phone] Floydene Flock [phone] Residential & Outpatient Substance Abuse Program  3026863418  Abuse/Neglect [facility] Child Abuse Hotline [phone] [facility] Child Abuse Hotline [phone] (After Hours)  Emergency Shelter [facility] [phone]  [facility] [phone] [facility] [phone]  MRSA Hotline #:   (902)488-1415    [facility] Resources  [facility]     [employer]                          [facility] Dept. [address] [place]                       [address]      [address]  Patrecia Pace  Michell Heinrich Phone:  [phone]                                   Phone:  [phone]                 Phone:  [phone]  [facility] Phone:  [phone]  [facility] Child Abuse Hotline [phone] (After Hours)         Anxiety and Panic Attacks Your caregiver has informed you that you are having an anxiety or panic attack. There may be many forms of this. Most of the time these attacks come suddenly and without warning. They come at any time of day, including periods of sleep, and at any time of life. They may be strong and unexplained. Although panic attacks are very scary, they are physically harmless. Sometimes the cause of your anxiety is not known. Anxiety is a protective mechanism of the body in its fight or flight mechanism. Most of these perceived danger situations are actually nonphysical situations (such as anxiety over losing a job). CAUSES  The causes of an anxiety or panic attack are many. Panic attacks may occur in otherwise healthy people given a certain set of circumstances. There may be a genetic cause for panic attacks. Some medications may also have anxiety as a side effect. SYMPTOMS  Some of the most common feelings are:  Intense terror.   Dizziness, feeling faint.   Hot and cold flashes.   Fear of going crazy.   Feelings that nothing is real.   Sweating.    Shaking.   Chest pain or a fast heartbeat (palpitations).   Smothering, choking sensations.   Feelings of impending doom and that death is near.   Tingling of extremities, this may be from over-breathing.   Altered reality (derealization).   Being detached from yourself (depersonalization).  Several symptoms can be present to make up anxiety or panic attacks. DIAGNOSIS  The evaluation by your caregiver will depend on the type of symptoms you are experiencing. The diagnosis of anxiety or panic attack is made when no physical illness can be determined to be a cause of the symptoms. TREATMENT  Treatment to prevent anxiety and panic attacks may include:  Avoidance of circumstances that cause anxiety.   Reassurance and relaxation.   Regular exercise.   Relaxation therapies, such as yoga.   Psychotherapy with a psychiatrist or therapist.   Avoidance of caffeine, alcohol and illegal drugs.   Prescribed medication.  SEEK IMMEDIATE MEDICAL CARE IF:   You experience panic attack symptoms that are different than your usual symptoms.   You have any worsening or concerning symptoms.  Document Released: 06/28/2005 Document Revised: 06/17/2011 Document Reviewed: 10/30/2009 [facility] Patient Information 2012 [place], [place].      Depression, Adolescent and Adult Depression is a true and treatable medical condition. In general there are two kinds of depression:  Depression we all experience in some form. For example depression from the death of a loved one, financial distress or natural disasters will trigger or increase depression.   Clinical depression, on the other hand, appears without an apparent cause or reason. This depression is a disease. Depression may be caused by chemical imbalance in the body and brain or may come as a response to a physical illness. Alcohol and other drugs can cause depression.  DIAGNOSIS  The diagnosis of depression is usually based upon symptoms  and medical history. TREATMENT  Treatments for depression fall into three categories. These are:  Drug therapy. There are many medicines that treat depression. Responses may vary and sometimes trial and error is necessary to determine the best medicines and dosage for a particular patient.   Psychotherapy, also called talking treatments, helps people resolve their problems by looking at them from a different point of view and by giving people insight into their own personal makeup. Traditional psychotherapy looks at a childhood source of a problem. Other psychotherapy will look at current conflicts and move toward solving those. If the cause of depression is drug use, counseling is available to help abstain. In time the depression will usually improve. If there were underlying causes for the chemical use, they can be addressed.   ECT (electroconvulsive therapy) or shock treatment is not as commonly used today. It is a very effective treatment for severe suicidal depression. During ECT electrical impulses are applied to the head. These impulses cause a generalized seizure. It can be effective but causes a loss of memory for recent events. Sometimes this loss of memory may include the last several months.  Treat all depression or suicide threats as serious. Obtain professional help. Do not wait to see if serious depression will get better over time without help. Seek help for yourself or those around you. In the [place] the number to the National Suicide Help Lines With 24 Hour Help Are: 1-800-SUICIDE 604-531-0771 Document Released: 06/25/2000 Document Revised: 06/17/2011 Document Reviewed: 02/14/2008 [facility] Patient Information 2012 [place], [place].      Cocaine Abuse and Chemical Dependency WHEN IS DRUG USE A PROBLEM? Anytime drug use is interfering with normal living activities it has become abuse. This includes problems with family and friends. Psychological dependence has developed when your  mind tells you that the drug is needed. This is usually followed by physical dependence which has developed when continuing increases of drug are required to get the same feeling or "high". This is known as addiction or chemical dependency. A person's risk is much higher if there is a history of chemical dependency in the family. SIGNS OF CHEMICAL DEPENDENCY:  Been told by friends or family that drugs have become a problem.   Fighting when using drugs.   Having blackouts (not remembering what you do while using).   Feel sick from using drugs but continue using.   Lie about use or amounts of drugs (chemicals) used.   Need chemicals to get you going.   Suffer in work International aid/development worker or school because of drug use.   Get sick from use of drugs but continue to use anyway.   Need drugs to relate to people or feel comfortable in social situations.   Use drugs to forget problems.  Yes answered to any of the above signs of chemical dependency indicates there are problems. The longer the use of drugs continues, the greater the problems will become. If there is a family history of drug or alcohol use it is best not to experiment with these drugs. Experimentation leads to tolerance and needing to use more of the drug to get the same feeling. This is followed by addiction where drugs become the most important part of life. It becomes more important to take drugs than participate in the other usual activities of life including relating to friends and family. Addiction is followed by dependency where drugs are now needed not just to get high but to feel normal. Addiction cannot be cured but it  can be stopped. This often requires outside help and the care of professionals. Treatment centers are listed in the yellow pages under: Cocaine, Narcotics, and Alcoholics Anonymous. Most hospitals and clinics can refer you to a specialized care center. WHAT IS COCAINE? Cocaine is a strong nervous system stimulant which  speeds up the body and gives the user the feeling that they have increased energy, loss of appetite and feelings of great pleasure. This "high" which begins within several minutes and lasts for less than an hour is followed by a "crash". The crash and depressed feelings that come with it cause a craving for the drug to regain the high. HOW IS COCANINE USED? Cocaine is snorted, injected, and smoked as free- base or crack. Because smoking the drug produces a greater high it is also associated with a greater low. It is therefore more rapidly addicting. WHAT ARE THE EFFECTS OF COCAINE? It is an anesthetic (pain killer) and a stimulant (it causes a high which gives a false feeling of well being). It increases heart and breathing rates with increases in body temperature and blood pressure. It removes appetite. It causes seizures (convulsions) along with nausea (feeling sick to your stomach), vomiting and stomach pain. This dangerous combination can lead to death. Trying to keep the high feeling leads to greater and greater drug use and this leads to addiction. Addiction can only be helped by stopping use of all chemicals. This is hard but may save your life. If the addiction is continued, the only possible outcome is loss of self respect and self esteem, violence, death, and eventually prison if the addict is fortunate enough to be caught and able to receive help prior to this last life ending event. OTHER HEALTH RISKS OF COCAINE AND ALL DRUG USE ARE:  The increased possibility of getting AIDS or hepatitis (liver inflammation).   Having a baby born which is addicted to cocaine and must go through painful withdrawal including shaking, jerking, and crying in pain. Many of the babies die. Other babies go through life with lifelong disabilities and learning problems.  HOW TO STAY DRUG FREE ONCE YOU HAVE QUIT USING:  Develop healthy activities and form friends who do not use drugs.   Stay away from the drug  scene.   Tell the pusher or former friend you have other better things to do.   Have ready excuses available about why you cannot use.  For more help or information contact your local physician, clinic, hospital or dial 1-800-cocaine (249)467-9514). Document Released: 06/25/2000 Document Revised: 06/17/2011 Document Reviewed: 02/14/2008 [facility] Patient Information 2012 [place], [place].      Alcohol Problems Most adults who drink alcohol drink in moderation (not a lot) are at low risk for developing problems related to their drinking. However, all drinkers, including low-risk drinkers, should know about the health risks connected with drinking alcohol. RECOMMENDATIONS FOR LOW-RISK DRINKING  Drink in moderation. Moderate drinking is defined as follows:   Men - no more than 2 drinks per day.   Nonpregnant women - no more than 1 drink per day.   Over age 50 - no more than 1 drink per day.  A standard drink is 12 grams of pure alcohol, which is equal to a 12 ounce bottle of beer or wine cooler, a 5 ounce glass of wine, or 1.5 ounces of distilled spirits (such as whiskey, brandy, vodka, or rum).  ABSTAIN FROM (DO NOT DRINK) ALCOHOL:  When pregnant or considering pregnancy.   When taking  a medication that interacts with alcohol.   If you are alcohol dependent.   A medical condition that prohibits drinking alcohol (such as ulcer, liver disease, or heart disease).  DISCUSS WITH YOUR CAREGIVER:  If you are at risk for coronary heart disease, discuss the potential benefits and risks of alcohol use: Light to moderate drinking is associated with lower rates of coronary heart disease in certain populations (for example, men over age 70 and postmenopausal women). Infrequent or nondrinkers are advised not to begin light to moderate drinking to reduce the risk of coronary heart disease so as to avoid creating an alcohol-related problem. Similar protective effects can likely be gained through  proper diet and exercise.   Women and the elderly have smaller amounts of body water than men. As a result women and the elderly achieve a higher blood alcohol concentration after drinking the same amount of alcohol.   Exposing a fetus to alcohol can cause a broad range of birth defects referred to as Fetal Alcohol Syndrome (FAS) or Alcohol-Related Birth Defects (ARBD). Although FAS/ARBD is connected with excessive alcohol consumption during pregnancy, studies also have reported neurobehavioral problems in infants born to mothers reporting drinking an average of 1 drink per day during pregnancy.   Heavier drinking (the consumption of more than 4 drinks per occasion by men and more than 3 drinks per occasion by women) impairs learning (cognitive) and psychomotor functions and increases the risk of alcohol-related problems, including accidents and injuries.  CAGE QUESTIONS:   Have you ever felt that you should Cut down on your drinking?   Have people Annoyed you by criticizing your drinking?   Have you ever felt bad or Guilty about your drinking?   Have you ever had a drink first thing in the morning to steady your nerves or get rid of a hangover (Eye opener)?  If you answered positively to any of these questions: You may be at risk for alcohol-related problems if alcohol consumption is:   Men: Greater than 14 drinks per week or more than 4 drinks per occasion.   Women: Greater than 7 drinks per week or more than 3 drinks per occasion.  Do you or your family have a medical history of alcohol-related problems, such as:  Blackouts.   Sexual dysfunction.   Depression.   Trauma.   Liver dysfunction.   Sleep disorders.   Hypertension.   Chronic abdominal pain.   Has your drinking ever caused you problems, such as problems with your family, problems with your work (or school) performance, or accidents/injuries?   Do you have a compulsion to drink or a preoccupation with drinking?    Do you have poor control or are you unable to stop drinking once you have started?   Do you have to drink to avoid withdrawal symptoms?   Do you have problems with withdrawal such as tremors, nausea, sweats, or mood disturbances?   Does it take more alcohol than in the past to get you high?   Do you feel a strong urge to drink?   Do you change your plans so that you can have a drink?   Do you ever drink in the morning to relieve the shakes or a hangover?  If you have answered a number of the previous questions positively, it may be time for you to talk to your caregivers, family, and friends and see if they think you have a problem. Alcoholism is a chemical dependency that keeps getting worse and will  eventually destroy your health and relationships. Many alcoholics end up dead, impoverished, or in prison. This is often the end result of all chemical dependency.  Do not be discouraged if you are not ready to take action immediately.   Decisions to change behavior often involve up and down desires to change and feeling like you cannot decide.   Try to think more seriously about your drinking behavior.   Think of the reasons to quit.  WHERE TO GO FOR ADDITIONAL INFORMATION   The National Institute on Alcohol Abuse and Alcoholism (NIAAA)www.niaaa.nih.gov   [employer] on Alcoholism and Drug Dependence (NCADD)www.ncadd.org   American Society of Addiction Medicine (ASAM)www.https://anderson-johnson.com/  Document Released: 06/28/2005 Document Revised: 06/17/2011 Document Reviewed: 02/14/2008 [facility] Patient Information 2012 [place], [place].

## 2011-10-19 NOTE — ED Provider Notes (Signed)
Psychiatrist/telepsych md has evaluated pt. Psychiatrist indicates recommends discharge home with outpatient referrals/resource guide, and starting lexapro 10 mg a day.   Recheck pt, pt awake and alert. Oriented. Normal mood and affect. Smiles. States is not hearing any voices. Thought processes appear clear, pt not delusional. Denies any thoughts of self harm or harm to others. States is feeling better. Is agreeable w recommendations of meds per psychiatrist and outpt follow up, pt does not feel he needs inpt tx and feels ready for d/c. Does not intermittent feelings of anxiety, will give small quantity bzd rx for same. Pt states will follow up as outpt.   Suzi Roots, MD 10/19/11 [phone]

## 2012-01-17 ENCOUNTER — Encounter (HOSPITAL_COMMUNITY): Payer: Self-pay | Admitting: *Deleted

## 2012-01-17 ENCOUNTER — Emergency Department (HOSPITAL_COMMUNITY)
Admission: EM | Admit: 2012-01-17 | Discharge: 2012-01-17 | Disposition: A | Discharge status: Home / Self Care | Payer: Medicaid Other | Attending: Emergency Medicine | Admitting: Emergency Medicine

## 2012-01-17 DIAGNOSIS — F17200 Nicotine dependence, unspecified, uncomplicated: Secondary | ICD-10-CM | POA: Insufficient documentation

## 2012-01-17 DIAGNOSIS — L0291 Cutaneous abscess, unspecified: Secondary | ICD-10-CM | POA: Insufficient documentation

## 2012-01-17 DIAGNOSIS — E119 Type 2 diabetes mellitus without complications: Secondary | ICD-10-CM | POA: Insufficient documentation

## 2012-01-17 DIAGNOSIS — S90569A Insect bite (nonvenomous), unspecified ankle, initial encounter: Secondary | ICD-10-CM | POA: Insufficient documentation

## 2012-01-17 DIAGNOSIS — Z794 Long term (current) use of insulin: Secondary | ICD-10-CM | POA: Insufficient documentation

## 2012-01-17 DIAGNOSIS — L0390 Cellulitis, unspecified: Secondary | ICD-10-CM | POA: Insufficient documentation

## 2012-01-17 DIAGNOSIS — Z833 Family history of diabetes mellitus: Secondary | ICD-10-CM | POA: Insufficient documentation

## 2012-01-17 DIAGNOSIS — I10 Essential (primary) hypertension: Secondary | ICD-10-CM | POA: Insufficient documentation

## 2012-01-17 MED ORDER — DOXYCYCLINE HYCLATE 100 MG PO CAPS: 100.0000 mg | ORAL_CAPSULE | Freq: Two times a day (BID) | ORAL | Status: AC

## 2012-01-17 MED ORDER — IBUPROFEN 800 MG PO TABS: 800.0000 mg | ORAL_TABLET | Freq: Three times a day (TID) | ORAL | Status: AC

## 2012-01-17 MED ORDER — IBUPROFEN 800 MG PO TABS
800.0000 mg | ORAL_TABLET | Freq: Once | ORAL | Status: AC
  Administered 2012-01-17: 800 mg via ORAL
  Filled 2012-01-17: qty 1

## 2012-01-17 MED ORDER — DOXYCYCLINE HYCLATE 100 MG PO TABS
100.0000 mg | ORAL_TABLET | Freq: Once | ORAL | Status: AC
  Administered 2012-01-17: 100 mg via ORAL
  Filled 2012-01-17: qty 1

## 2012-01-17 NOTE — ED Notes (Signed)
Discharge instructions reviewed with pt, questions answered. Pt verbalized understanding.  

## 2012-01-17 NOTE — ED Provider Notes (Signed)
History     CSN: [id number]  Arrival date & time 01/17/12  2017   First MD Initiated Contact with Patient 01/17/12 2254      Chief Complaint  Patient presents with  . Insect Bite    (Consider location/radiation/quality/duration/timing/severity/associated sxs/prior treatment) The history is limited by the condition of the patient.   HX per PT. Believes he was bit by an insect today. Has an area on his right lower calf with redness and pain. He did not see a spider or insect but is worried that it may be a spider bite. No f/c, no n/v/d, no other bites or lesions. Mild to mod in severity. Hurts to touch, no alleviating factors, no weakness or numbness.  Past Medical History  Diagnosis Date  . Hypertension   . Diabetes mellitus     Past Surgical History  Procedure Date  . Neck surgery     Family History  Problem Relation Age of Onset  . Diabetes Brother     History  Substance Use Topics  . Smoking status: Current Everyday Smoker  . Smokeless tobacco: Not on file  . Alcohol Use: Yes      Review of Systems  Constitutional: Negative for fever and chills.  HENT: Negative for neck pain and neck stiffness.   Eyes: Negative for redness.  Respiratory: Negative for shortness of breath.   Cardiovascular: Negative for chest pain.  Gastrointestinal: Negative for abdominal pain.  Genitourinary: Negative for flank pain.  Musculoskeletal: Negative for back pain.  Skin: Positive for rash.  Neurological: Negative for headaches.  All other systems reviewed and are negative.    Allergies  Review of patient's allergies indicates no known allergies.  Home Medications   Current Outpatient Rx  Name Route Sig Dispense Refill  . BC HEADACHE POWDER PO Oral Take 1 packet by mouth daily as needed. For pain    . ASPIRIN EFFERVESCENT 325 MG PO TBEF Oral Take 325 mg by mouth once as needed. For head cold relief    . ESCITALOPRAM OXALATE 10 MG PO TABS Oral Take 1 tablet (10 mg total) by  mouth daily. 30 tablet 0  . INSULIN ASPART 100 UNIT/ML [place] SOLN Subcutaneous Inject 5 Units into the skin 2 (two) times daily.    . INSULIN GLARGINE 100 UNIT/ML [place] SOLN Subcutaneous Inject 10 Units into the skin at bedtime.    Marland Kitchen METFORMIN HCL 500 MG PO TABS Oral Take 500 mg by mouth 2 (two) times daily with a meal.    . PRESCRIPTION MEDICATION Oral Take 1 tablet by mouth daily. Blood pressure medication      BP 178/119  Temp 98.2 F (36.8 C) (Oral)  Resp 20  Ht 5\' 6"  (1.676 m)  Wt 175 lb (79.379 kg)  BMI 28.25 kg/m2  SpO2 97%  Physical Exam  Constitutional: He is oriented to person, place, and time. He appears well-developed and well-nourished.  HENT:  Head: Normocephalic and atraumatic.  Eyes: Conjunctivae and EOM are normal. Pupils are equal, round, and reactive to light.  Neck: Trachea normal. Neck supple. No thyromegaly present.  Cardiovascular: Normal rate, regular rhythm, S1 normal, S2 normal and normal pulses.     No systolic murmur is present   No diastolic murmur is present  Pulses:      Radial pulses are 2+ on the right side, and 2+ on the left side.  Pulmonary/Chest: Effort normal and breath sounds normal. He has no wheezes. He has no rhonchi. He has no rales.  He exhibits no tenderness.  Abdominal: Soft. Normal appearance and bowel sounds are normal. There is no tenderness. There is no CVA tenderness and negative Murphy's sign.  Musculoskeletal:       RLE with small area of central purulence and surrounding erythema and warmth. No streaking lymphangitis, distal n/v intact  Neurological: He is alert and oriented to person, place, and time. He has normal strength. No cranial nerve deficit or sensory deficit. GCS eye subscore is 4. GCS verbal subscore is 5. GCS motor subscore is 6.  Skin: Skin is warm and dry. No rash noted. He is not diaphoretic.  Psychiatric: His speech is normal.       Cooperative and appropriate    ED Course  Procedures (including critical care  time)  R calf ink oultlined erythema. I unroofed purulent area and some clear to purulent drainage present. No deep abscess.    MDM   Insect bite R calf. Plan Rx doxy and f/u 2 days for recheck. No tick parts identified and no systemic symptoms of infection         Sunnie Nielsen, MD 01/18/12 [phone]

## 2012-01-17 NOTE — ED Notes (Signed)
Insect bite to rt lower posterior leg,

## 2012-08-13 ENCOUNTER — Emergency Department (HOSPITAL_COMMUNITY): Payer: Self-pay

## 2012-08-13 ENCOUNTER — Emergency Department (HOSPITAL_COMMUNITY)
Admission: EM | Admit: 2012-08-13 | Discharge: 2012-08-13 | Disposition: A | Discharge status: Home / Self Care | Payer: Self-pay | Attending: Emergency Medicine | Admitting: Emergency Medicine

## 2012-08-13 ENCOUNTER — Encounter (HOSPITAL_COMMUNITY): Payer: Self-pay

## 2012-08-13 DIAGNOSIS — Z79899 Other long term (current) drug therapy: Secondary | ICD-10-CM | POA: Insufficient documentation

## 2012-08-13 DIAGNOSIS — Y939 Activity, unspecified: Secondary | ICD-10-CM | POA: Insufficient documentation

## 2012-08-13 DIAGNOSIS — S46009A Unspecified injury of muscle(s) and tendon(s) of the rotator cuff of unspecified shoulder, initial encounter: Secondary | ICD-10-CM

## 2012-08-13 DIAGNOSIS — S43499A Other sprain of unspecified shoulder joint, initial encounter: Secondary | ICD-10-CM | POA: Insufficient documentation

## 2012-08-13 DIAGNOSIS — S4980XA Other specified injuries of shoulder and upper arm, unspecified arm, initial encounter: Secondary | ICD-10-CM | POA: Insufficient documentation

## 2012-08-13 DIAGNOSIS — S46819A Strain of other muscles, fascia and tendons at shoulder and upper arm level, unspecified arm, initial encounter: Secondary | ICD-10-CM | POA: Insufficient documentation

## 2012-08-13 DIAGNOSIS — E119 Type 2 diabetes mellitus without complications: Secondary | ICD-10-CM | POA: Insufficient documentation

## 2012-08-13 DIAGNOSIS — Y929 Unspecified place or not applicable: Secondary | ICD-10-CM | POA: Insufficient documentation

## 2012-08-13 DIAGNOSIS — I10 Essential (primary) hypertension: Secondary | ICD-10-CM | POA: Insufficient documentation

## 2012-08-13 DIAGNOSIS — Z794 Long term (current) use of insulin: Secondary | ICD-10-CM | POA: Insufficient documentation

## 2012-08-13 DIAGNOSIS — S46219A Strain of muscle, fascia and tendon of other parts of biceps, unspecified arm, initial encounter: Secondary | ICD-10-CM

## 2012-08-13 DIAGNOSIS — F17200 Nicotine dependence, unspecified, uncomplicated: Secondary | ICD-10-CM | POA: Insufficient documentation

## 2012-08-13 DIAGNOSIS — S46909A Unspecified injury of unspecified muscle, fascia and tendon at shoulder and upper arm level, unspecified arm, initial encounter: Secondary | ICD-10-CM | POA: Insufficient documentation

## 2012-08-13 DIAGNOSIS — X503XXA Overexertion from repetitive movements, initial encounter: Secondary | ICD-10-CM | POA: Insufficient documentation

## 2012-08-13 MED ORDER — OXYCODONE-ACETAMINOPHEN 5-325 MG PO TABS: 1.0000 | ORAL_TABLET | ORAL | Status: DC | PRN

## 2012-08-13 NOTE — ED Notes (Signed)
Pt injuried left shoulder on Tuesday while moving furniture, last took a hydrocodone yesterday that was left over from a toothache, limited ROM of left arm due to pain

## 2012-08-13 NOTE — ED Provider Notes (Signed)
History     CSN: [id number]  Arrival date & time 08/13/12  1411   None     Chief Complaint  Patient presents with  . Shoulder Pain   HPI [name] is a 47 y.o. male who presents to the ED with shoulder pain. The pain is located in the left shoulder. The onset was sudden. Injured 5 days ago.  He was helping someone move furniture when the pain started. He has noted a large swollen area in the left bicep. He can not raise his hand over his head. The history was provided by the patient.  Past Medical History  Diagnosis Date  . Hypertension   . Diabetes mellitus     Past Surgical History  Procedure Date  . Neck surgery     Family History  Problem Relation Age of Onset  . Diabetes Brother     History  Substance Use Topics  . Smoking status: Current Every Day Smoker  . Smokeless tobacco: Not on file  . Alcohol Use: Yes      Review of Systems  Constitutional: Negative for fever and chills.  HENT: Negative for facial swelling and neck pain.   Eyes: Negative.   Respiratory: Negative for cough and wheezing.   Cardiovascular: Negative for chest pain and palpitations.  Gastrointestinal: Negative for nausea, vomiting and abdominal pain.  Genitourinary: Negative for dysuria and frequency.  Musculoskeletal: Negative for back pain.       Left deltoid pain, left shoulder pain  Skin: Negative for wound.  Neurological: Negative for dizziness and headaches.  Psychiatric/Behavioral: Negative for confusion. The patient is not nervous/anxious.     Allergies  Review of patient's allergies indicates no known allergies.  Home Medications   Current Outpatient Rx  Name  Route  Sig  Dispense  Refill  . INSULIN GLARGINE 100 UNIT/ML [employer] SOLN   Subcutaneous   Inject 10 Units into the skin at bedtime.         Marland Kitchen METFORMIN HCL 500 MG PO TABS   Oral   Take 500 mg by mouth 2 (two) times daily with a meal.         . ESCITALOPRAM OXALATE 10 MG PO TABS   Oral   Take 1 tablet  (10 mg total) by mouth daily.   30 tablet   0     BP 128/89  Pulse 105  Temp 98 F (36.7 C) (Oral)  Resp 18  Ht 5\' 6"  (1.676 m)  Wt 180 lb (81.647 kg)  BMI 29.05 kg/m2  SpO2 97%  Physical Exam  Constitutional: He is oriented to person, place, and time. He appears well-developed and well-nourished. No distress.  HENT:  Head: Normocephalic.  Eyes: EOM are normal.  Neck: Neck supple.  Cardiovascular:       tachycardia  Pulmonary/Chest: Effort normal. No respiratory distress.  Musculoskeletal:       Left deltoid with deformity. Patient unable to raise hand above head. Tender with palpation anterior shoulder. Radial pulse strong adequate circulation, good grip and touch sensation.  Neurological: He is alert and oriented to person, place, and time.  Psychiatric: He has a normal mood and affect. His behavior is normal. Judgment and thought content normal.   ProceduresLabs Reviewed - No data to display Dg Shoulder Left  08/13/2012  *RADIOLOGY REPORT*  Clinical Data: Pain post lifting injury.  LEFT SHOULDER - 2+ VIEW  Comparison: None.  Findings: Negative for fracture, dislocation, or other acute abnormality.  Normal alignment  and mineralization. No significant degenerative change.  Regional soft tissues unremarkable.  IMPRESSION:  Negative   Original Report Authenticated By: D. Andria Rhein, MD    Assessment: 47 y.o. male with left shoulder and arm pain due to injury   Rotator cuff injury   Possible bicep tendon injury  Plan:  Sling   Pain management   Consult with Dr. Hilda Lias and patient to come for MRI tomorrow at 1:45 pm    I have reviewed this patient's vital signs, nurses notes, appropriate imaging.  I have discussed results and plan of care with the patient and he voices understanding.   Medication List     As of 08/13/2012  4:57 PM    START taking these medications         oxyCODONE-acetaminophen 5-325 MG per tablet   Commonly known as: PERCOCET/ROXICET   Take 1 tablet  by mouth every 4 (four) hours as needed for pain.      ASK your doctor about these medications         escitalopram 10 MG tablet   Commonly known as: LEXAPRO   Take 1 tablet (10 mg total) by mouth daily.      LANTUS SOLOSTAR 100 UNIT/ML injection   Generic drug: insulin glargine      metFORMIN 500 MG tablet   Commonly known as: GLUCOPHAGE          Where to get your medications    These are the prescriptions that you need to pick up.   You may get these medications from any pharmacy.         oxyCODONE-acetaminophen 5-325 MG per tablet           Janne Napoleon, NP 08/13/12 [address] [place], [place] 08/13/12 1719

## 2012-08-13 NOTE — ED Notes (Signed)
H. Bryant, PA at bedside. 

## 2012-08-13 NOTE — ED Notes (Signed)
Pt states he was helping some one move and injured his left sholuder

## 2012-08-13 NOTE — ED Notes (Signed)
Patient transported to X-ray 

## 2012-08-14 ENCOUNTER — Ambulatory Visit (HOSPITAL_COMMUNITY): Payer: Self-pay

## 2012-08-14 NOTE — ED Provider Notes (Signed)
Medical screening examination/treatment/procedure(s) were performed by non-physician practitioner and as supervising physician I was immediately available for consultation/collaboration.   Lyanne Co, MD 08/14/12 1116

## 2012-08-17 ENCOUNTER — Ambulatory Visit (HOSPITAL_COMMUNITY)
Admission: RE | Admit: 2012-08-17 | Discharge: 2012-08-17 | Disposition: A | Discharge status: Home / Self Care | Payer: Self-pay | Source: Ambulatory Visit | Attending: Orthopaedic Surgery | Admitting: Orthopaedic Surgery

## 2012-08-17 DIAGNOSIS — S46819A Strain of other muscles, fascia and tendons at shoulder and upper arm level, unspecified arm, initial encounter: Secondary | ICD-10-CM | POA: Insufficient documentation

## 2012-08-17 DIAGNOSIS — X500XXA Overexertion from strenuous movement or load, initial encounter: Secondary | ICD-10-CM | POA: Insufficient documentation

## 2012-08-17 DIAGNOSIS — M25519 Pain in unspecified shoulder: Secondary | ICD-10-CM | POA: Insufficient documentation

## 2012-12-01 ENCOUNTER — Emergency Department (HOSPITAL_COMMUNITY)
Admission: EM | Admit: 2012-12-01 | Discharge: 2012-12-01 | Disposition: A | Discharge status: Home / Self Care | Payer: Self-pay | Attending: Emergency Medicine | Admitting: Emergency Medicine

## 2012-12-01 ENCOUNTER — Encounter (HOSPITAL_COMMUNITY): Payer: Self-pay | Admitting: *Deleted

## 2012-12-01 DIAGNOSIS — F17200 Nicotine dependence, unspecified, uncomplicated: Secondary | ICD-10-CM | POA: Insufficient documentation

## 2012-12-01 DIAGNOSIS — Z794 Long term (current) use of insulin: Secondary | ICD-10-CM | POA: Insufficient documentation

## 2012-12-01 DIAGNOSIS — Z79899 Other long term (current) drug therapy: Secondary | ICD-10-CM | POA: Insufficient documentation

## 2012-12-01 DIAGNOSIS — I10 Essential (primary) hypertension: Secondary | ICD-10-CM | POA: Insufficient documentation

## 2012-12-01 DIAGNOSIS — K029 Dental caries, unspecified: Secondary | ICD-10-CM | POA: Insufficient documentation

## 2012-12-01 DIAGNOSIS — E119 Type 2 diabetes mellitus without complications: Secondary | ICD-10-CM | POA: Insufficient documentation

## 2012-12-01 MED ORDER — AMOXICILLIN 500 MG PO CAPS: 500.0000 mg | ORAL_CAPSULE | Freq: Three times a day (TID) | ORAL | Status: DC

## 2012-12-01 MED ORDER — HYDROCODONE-ACETAMINOPHEN 5-325 MG PO TABS
2.0000 | ORAL_TABLET | Freq: Once | ORAL | Status: AC
  Administered 2012-12-01: 2 via ORAL
  Filled 2012-12-01: qty 2

## 2012-12-01 MED ORDER — PROMETHAZINE HCL 12.5 MG PO TABS
12.5000 mg | ORAL_TABLET | Freq: Once | ORAL | Status: AC
  Administered 2012-12-01: 12.5 mg via ORAL
  Filled 2012-12-01: qty 1

## 2012-12-01 MED ORDER — AMOXICILLIN 250 MG PO CAPS
500.0000 mg | ORAL_CAPSULE | Freq: Once | ORAL | Status: AC
  Administered 2012-12-01: 500 mg via ORAL
  Filled 2012-12-01: qty 2

## 2012-12-01 MED ORDER — HYDROCODONE-ACETAMINOPHEN 7.5-325 MG PO TABS: 1.0000 | ORAL_TABLET | ORAL | Status: DC | PRN

## 2012-12-01 NOTE — ED Notes (Signed)
Pain rt upper gum, says he has a bad broken tooth

## 2012-12-01 NOTE — ED Provider Notes (Signed)
Medical screening examination/treatment/procedure(s) were performed by non-physician practitioner and as supervising physician I was immediately available for consultation/collaboration.  Toy Baker, MD 12/01/12 [phone]

## 2012-12-01 NOTE — ED Notes (Signed)
Pt c/o pain to tooth on top right side of mouth. Pt had an appt on May 30th to have it pulled but is requesting pain meds and antibiotics until then.

## 2012-12-01 NOTE — ED Provider Notes (Signed)
History     CSN: [id number]  Arrival date & time 12/01/12  4098   First MD Initiated Contact with Patient 12/01/12 2012      Chief Complaint  Patient presents with  . Dental Pain    (Consider location/radiation/quality/duration/timing/severity/associated sxs/prior treatment) Patient is a 47 y.o. male presenting with tooth pain. The history is provided by the patient.  Dental Pain Location:  Upper and lower Quality:  Throbbing Severity:  Moderate Onset quality:  Gradual Duration:  1 month Timing:  Intermittent Progression:  Worsening Chronicity:  Chronic Context: dental caries, dental fracture and poor dentition   Relieved by:  Nothing Worsened by:  Nothing tried Ineffective treatments: cloves. Associated symptoms: no neck pain   Risk factors: periodontal disease and smoking     Past Medical History  Diagnosis Date  . Hypertension   . Diabetes mellitus     Past Surgical History  Procedure Laterality Date  . Neck surgery      Family History  Problem Relation Age of Onset  . Diabetes Brother     History  Substance Use Topics  . Smoking status: Current Every Day Smoker  . Smokeless tobacco: Not on file  . Alcohol Use: No      Review of Systems  Constitutional: Negative for activity change.       All ROS Neg except as noted in HPI  HENT: Positive for dental problem. Negative for nosebleeds and neck pain.   Eyes: Negative for photophobia and discharge.  Respiratory: Negative for cough, shortness of breath and wheezing.   Cardiovascular: Negative for chest pain and palpitations.  Gastrointestinal: Negative for abdominal pain and blood in stool.  Genitourinary: Negative for dysuria, frequency and hematuria.  Musculoskeletal: Negative for back pain and arthralgias.  Skin: Negative.   Neurological: Negative for dizziness, seizures and speech difficulty.  Psychiatric/Behavioral: Negative for hallucinations and confusion.    Allergies  Review of patient's  allergies indicates no known allergies.  Home Medications   Current Outpatient Rx  Name  Route  Sig  Dispense  Refill  . amoxicillin (AMOXIL) 500 MG capsule   Oral   Take 1 capsule (500 mg total) by mouth 3 (three) times daily.   21 capsule   0   . EXPIRED: escitalopram (LEXAPRO) 10 MG tablet   Oral   Take 1 tablet (10 mg total) by mouth daily.   30 tablet   0   . HYDROcodone-acetaminophen (NORCO) 7.5-325 MG per tablet   Oral   Take 1 tablet by mouth every 4 (four) hours as needed for pain.   20 tablet   0   . insulin glargine (LANTUS SOLOSTAR) 100 UNIT/ML injection   Subcutaneous   Inject 10 Units into the skin at bedtime.         . metFORMIN (GLUCOPHAGE) 500 MG tablet   Oral   Take 500 mg by mouth 2 (two) times daily with a meal.         . oxyCODONE-acetaminophen (PERCOCET/ROXICET) 5-325 MG per tablet   Oral   Take 1 tablet by mouth every 4 (four) hours as needed for pain.   20 tablet   0     BP 149/97  Pulse 95  Temp(Src) 97.7 F (36.5 C) (Oral)  Resp 18  Ht 5\' 7"  (1.702 m)  Wt 180 lb (81.647 kg)  BMI 28.19 kg/m2  SpO2 99%  Physical Exam  Nursing note and vitals reviewed. Constitutional: He is oriented to person, place, and time. He  appears well-developed and well-nourished.  Non-toxic appearance.  HENT:  Head: Normocephalic.  Right Ear: Tympanic membrane and external ear normal.  Left Ear: Tympanic membrane and external ear normal.  Multiple dental caries of the upper and lower jaw. No visible abscess. Airway patent. No swelling under the tongue.  Eyes: EOM and lids are normal. Pupils are equal, round, and reactive to light.  Neck: Normal range of motion. Neck supple. Carotid bruit is not present.  Cardiovascular: Normal rate, regular rhythm, normal heart sounds, intact distal pulses and normal pulses.   Pulmonary/Chest: Breath sounds normal. No respiratory distress.  Abdominal: Soft. Bowel sounds are normal. There is no tenderness. There is no  guarding.  Musculoskeletal: Normal range of motion.  Lymphadenopathy:       Head (right side): No submandibular adenopathy present.       Head (left side): No submandibular adenopathy present.    He has no cervical adenopathy.  Neurological: He is alert and oriented to person, place, and time. He has normal strength. No cranial nerve deficit or sensory deficit.  Skin: Skin is warm and dry.  Psychiatric: He has a normal mood and affect. His speech is normal.    ED Course  Procedures (including critical care time)  Labs Reviewed - No data to display No results found.   1. Dental caries       MDM  I have reviewed nursing notes, vital signs, and all appropriate lab and imaging results for this patient. Patient states he's had problems for quite some time. He is now had a tooth that has broken and is causing her more and more pain on the right side of his mouth. He is scheduled to have an appointment with the dentist on may 30th, but is having pain and wanted pain relief before that time.  Patient is given a prescription for Amoxil 500 mg, also Norco one every 4 hours.       Kathie Dike, PA-C 12/01/12 2039

## 2017-05-04 ENCOUNTER — Encounter (HOSPITAL_COMMUNITY): Payer: Self-pay | Admitting: *Deleted

## 2017-05-04 ENCOUNTER — Emergency Department (HOSPITAL_COMMUNITY): Payer: Self-pay

## 2017-05-04 ENCOUNTER — Emergency Department (HOSPITAL_COMMUNITY)
Admission: EM | Admit: 2017-05-04 | Discharge: 2017-05-04 | Disposition: A | Discharge status: Home / Self Care | Payer: Self-pay | Attending: Emergency Medicine | Admitting: Emergency Medicine

## 2017-05-04 DIAGNOSIS — R5381 Other malaise: Secondary | ICD-10-CM | POA: Insufficient documentation

## 2017-05-04 DIAGNOSIS — I10 Essential (primary) hypertension: Secondary | ICD-10-CM | POA: Insufficient documentation

## 2017-05-04 DIAGNOSIS — F17210 Nicotine dependence, cigarettes, uncomplicated: Secondary | ICD-10-CM | POA: Insufficient documentation

## 2017-05-04 DIAGNOSIS — M792 Neuralgia and neuritis, unspecified: Secondary | ICD-10-CM | POA: Insufficient documentation

## 2017-05-04 DIAGNOSIS — E1165 Type 2 diabetes mellitus with hyperglycemia: Secondary | ICD-10-CM | POA: Insufficient documentation

## 2017-05-04 DIAGNOSIS — E1140 Type 2 diabetes mellitus with diabetic neuropathy, unspecified: Secondary | ICD-10-CM | POA: Insufficient documentation

## 2017-05-04 DIAGNOSIS — R739 Hyperglycemia, unspecified: Secondary | ICD-10-CM

## 2017-05-04 LAB — CBC
HCT: 38.9 % — ABNORMAL LOW (ref 39.0–52.0)
Hemoglobin: 14 g/dL (ref 13.0–17.0)
MCH: 30.6 pg (ref 26.0–34.0)
MCHC: 36 g/dL (ref 30.0–36.0)
MCV: 85.1 fL (ref 78.0–100.0)
Platelets: 115 10*3/uL — ABNORMAL LOW (ref 150–400)
RBC: 4.57 MIL/uL (ref 4.22–5.81)
RDW: 12.9 % (ref 11.5–15.5)
WBC: 4.8 10*3/uL (ref 4.0–10.5)

## 2017-05-04 LAB — URINALYSIS, ROUTINE W REFLEX MICROSCOPIC
Bacteria, UA: NONE SEEN
Bilirubin Urine: NEGATIVE
Glucose, UA: >=500 mg/dL — AB
Hgb urine dipstick: NEGATIVE
Ketones, ur: NEGATIVE mg/dL
Leukocytes, UA: NEGATIVE
Nitrite: NEGATIVE
Protein, ur: 30 mg/dL — AB
Specific Gravity, Urine: 1.027 (ref 1.005–1.030)
Squamous Epithelial / LPF: NONE SEEN
pH: 5 (ref 5.0–8.0)

## 2017-05-04 LAB — BASIC METABOLIC PANEL
Anion gap: 13 (ref 5–15)
BUN: 11 mg/dL (ref 6–20)
CO2: 24 mmol/L (ref 22–32)
Calcium: 9.5 mg/dL (ref 8.9–10.3)
Chloride: 96 mmol/L — ABNORMAL LOW (ref 101–111)
Creatinine, Ser: 0.85 mg/dL (ref 0.61–1.24)
GFR calc Af Amer: >60 mL/min (ref >60)
GFR calc non Af Amer: >60 mL/min (ref >60)
Glucose, Bld: 573 mg/dL (ref 65–99)
Potassium: 3.6 mmol/L (ref 3.5–5.1)
Sodium: 133 mmol/L — ABNORMAL LOW (ref 135–145)

## 2017-05-04 LAB — CBG MONITORING, ED: Glucose-Capillary: 364 mg/dL — ABNORMAL HIGH (ref 65–99)

## 2017-05-04 MED ORDER — MORPHINE SULFATE (PF) 4 MG/ML IV SOLN
4.0000 mg | Freq: Once | INTRAVENOUS | Status: AC
  Administered 2017-05-04: 4 mg via INTRAVENOUS
  Filled 2017-05-04: qty 1

## 2017-05-04 MED ORDER — GABAPENTIN 100 MG PO CAPS: 100.0000 mg | ORAL_CAPSULE | Freq: Three times a day (TID) | ORAL | 0 refills | Status: DC

## 2017-05-04 MED ORDER — SODIUM CHLORIDE 0.9 % IV BOLUS (SEPSIS)
1000.0000 mL | Freq: Once | INTRAVENOUS | Status: AC
  Administered 2017-05-04: 1000 mL via INTRAVENOUS

## 2017-05-04 MED ORDER — LORAZEPAM 1 MG PO TABS
1.0000 mg | ORAL_TABLET | Freq: Once | ORAL | Status: AC
Start: 2017-05-04 — End: 2017-05-04
  Administered 2017-05-04: 1 mg via ORAL
  Filled 2017-05-04: qty 1

## 2017-05-04 MED ORDER — GABAPENTIN 100 MG PO CAPS
200.0000 mg | ORAL_CAPSULE | Freq: Once | ORAL | Status: AC
  Administered 2017-05-04: 200 mg via ORAL
  Filled 2017-05-04: qty 2

## 2017-05-04 MED ORDER — INSULIN ASPART 100 UNIT/ML ~~LOC~~ SOLN
6.0000 [IU] | Freq: Once | SUBCUTANEOUS | Status: AC
  Administered 2017-05-04: 6 [IU] via INTRAVENOUS
  Filled 2017-05-04: qty 1

## 2017-05-04 MED ORDER — METFORMIN HCL 1000 MG PO TABS: 1000.0000 mg | ORAL_TABLET | Freq: Two times a day (BID) | ORAL | 0 refills | Status: DC

## 2017-05-04 MED ORDER — INSULIN GLARGINE 100 UNIT/ML ~~LOC~~ SOLN: 10.0000 [IU] | Freq: Every day | SUBCUTANEOUS | 0 refills | Status: DC

## 2017-05-04 NOTE — ED Triage Notes (Signed)
Pt states that around 3 pm he got up and both legs were "wobbly" and has fallen 3-4 times today. Pt does admit to drinking 1 four loco today. Pt's BP is elevated and he states he can't afford his medication.

## 2017-05-04 NOTE — Discharge Instructions (Signed)
Your blood sugar is extremely high and likely has been for quite some time. This is likely the cause of the pain in your feet (neuropathy). It cannot be reversed but will worsen unless we can keep your glucose under control. You are being prescribed metformin and lantus which you have been on previously. Gabapentin may help alleviate some of the pain. You need to establish a new PCP as soon as you can.

## 2017-05-04 NOTE — ED Notes (Signed)
Gave EKG to Dr.Kohut 

## 2017-05-10 NOTE — ED Provider Notes (Signed)
[facility]GENCY DEPARTMENT Provider Note   CSN: [id number] Arrival date & time: 05/04/17  [id number]     History   Chief Complaint Chief Complaint  Patient presents with  . Extremity Weakness    HPI [name] is a 51 y.o. male.  HPI  51yM with LE weakness. Chronic for at least months but progressively worsening and several falls in last couple days. Says legs just go out. C/o b/l foot and LE "burning." Sounds like neuropathy from his description. Diabetic and not compliant. Drinks regularly including today.   Past Medical History:  Diagnosis Date  . Diabetes mellitus   . Hypertension     Patient Active Problem List   Diagnosis Date Noted  . 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 HYPERTENSION 03/06/2007  . GERD 03/06/2007    Past Surgical History:  Procedure Laterality Date  . NECK SURGERY         Home Medications    Prior to Admission medications   Medication Sig Start Date End Date Taking? Authorizing Provider  gabapentin (NEURONTIN) 100 MG capsule Take 1 capsule (100 mg total) by mouth 3 (three) times daily. 05/04/17   Raeford RazorKohut, Zoe Nordin, MD  insulin glargine (LANTUS) 100 UNIT/ML injection Inject 0.1 mLs (10 Units total) into the skin at bedtime. 05/04/17   Raeford RazorKohut, Andreika Vandagriff, MD  metFORMIN (GLUCOPHAGE) 1000 MG tablet Take 1 tablet (1,000 mg total) by mouth 2 (two) times daily. 05/04/17   Raeford RazorKohut, Ozie Lupe, MD    Family History Family History  Problem Relation Age of Onset  . Diabetes Brother     Social History Social History  Substance Use Topics  . Smoking status: Current Every Day Smoker    Packs/day: 1.00    Types: Cigarettes  . Smokeless tobacco: Never Used  . Alcohol use Yes     Allergies   Patient has no known allergies.   Review of Systems Review of Systems  All systems reviewed and negative, other than as noted in HPI.  Physical Exam Updated Vital Signs BP (!) 184/111   Pulse (!) 107   Temp 97.8 F (36.6 C) (Oral)    Resp (!) 25   Ht 5\' 7"  (1.702 m)   Wt 72.6 kg (160 lb)   SpO2 95%   BMI 25.06 kg/m   Physical Exam  Constitutional: He is oriented to person, place, and time. He appears well-developed and well-nourished. No distress.  HENT:  Head: Normocephalic and atraumatic.  Eyes: Conjunctivae are normal. Right eye exhibits no discharge. Left eye exhibits no discharge.  Neck: Neck supple.  Cardiovascular: Regular rhythm and normal heart sounds.  Exam reveals no gallop and no friction rub.   No murmur heard. Mild tachycardia  Pulmonary/Chest: Effort normal and breath sounds normal. No respiratory distress.  Abdominal: Soft. He exhibits no distension. There is no tenderness.  Musculoskeletal: He exhibits no edema or tenderness.  Neurological: He is alert and oriented to person, place, and time.  Mildly tremulous. No focal weakness.   Skin: Skin is warm and dry.  Psychiatric: He has a normal mood and affect. His behavior is normal. Thought content normal.  Nursing note and vitals reviewed.    ED Treatments / Results  Labs (all labs ordered are listed, but only abnormal results are displayed) Labs Reviewed  BASIC METABOLIC PANEL - Abnormal; Notable for the following:       Result Value   Sodium 133 (*)    Chloride 96 (*)    Glucose, Bld 573 (*)  All other components within normal limits  CBC - Abnormal; Notable for the following:    HCT 38.9 (*)    Platelets 115 (*)    All other components within normal limits  URINALYSIS, ROUTINE W REFLEX MICROSCOPIC - Abnormal; Notable for the following:    Color, Urine STRAW (*)    Glucose, UA >=500 (*)    Protein, ur 30 (*)    All other components within normal limits  CBG MONITORING, ED - Abnormal; Notable for the following:    Glucose-Capillary 364 (*)    All other components within normal limits    EKG  EKG Interpretation  Date/Time:  Wednesday May 04 2017 19:44:49 EDT Ventricular Rate:  118 PR Interval:    QRS Duration: 92 QT  Interval:  342 QTC Calculation: 480 R Axis:   92 Text Interpretation:  Sinus tachycardia Borderline right axis deviation Borderline prolonged QT interval Confirmed by Raeford Razor [phone]) on 05/04/2017 11:07:51 PM       Radiology No results found.  Procedures Procedures (including critical care time)  Medications Ordered in ED Medications  sodium chloride 0.9 % bolus 1,000 mL (0 mLs Intravenous Stopped 05/04/17 2206)  insulin aspart (novoLOG) injection 6 Units (6 Units Intravenous Given 05/04/17 2101)  sodium chloride 0.9 % bolus 1,000 mL (0 mLs Intravenous Stopped 05/04/17 2207)  morphine 4 MG/ML injection 4 mg (4 mg Intravenous Given 05/04/17 2110)  gabapentin (NEURONTIN) capsule 200 mg (200 mg Oral Given 05/04/17 2109)  LORazepam (ATIVAN) tablet 1 mg (1 mg Oral Given 05/04/17 2324)     Initial Impression / Assessment and Plan / ED Course  I have reviewed the triage vital signs and the nursing notes.  Pertinent labs & imaging results that were available during my care of the patient were reviewed by me and considered in my medical decision making (see chart for details).     51yM with LE. Maybe some mild global weakness. No focal deficits. Probably a combination of deconditioning and poor nutrition. Neuropathy not helping. Alcohol use further contributing to unsteadiness.   Stressed importance of compliance of meds and abstaining from etoh. Discussed that neuropathy will worsen if he doesn't take better care of himself. Previously on lantus/metformin. Scripts provided. Will try neurontin. Needs regular primary care.   Final Clinical Impressions(s) / ED Diagnoses   Final diagnoses:  Hyperglycemia  Physical deconditioning  Peripheral neuropathic pain    New Prescriptions Discharge Medication List as of 05/04/2017 11:12 PM    START taking these medications   Details  gabapentin (NEURONTIN) 100 MG capsule Take 1 capsule (100 mg total) by mouth 3 (three) times daily.,  Starting Wed 05/04/2017, Print    insulin glargine (LANTUS) 100 UNIT/ML injection Inject 0.1 mLs (10 Units total) into the skin at bedtime., Starting Wed 05/04/2017, Print    metFORMIN (GLUCOPHAGE) 1000 MG tablet Take 1 tablet (1,000 mg total) by mouth 2 (two) times daily., Starting Wed 05/04/2017, Print         Raeford Razor, MD 05/10/17 [phone]

## 2017-06-14 ENCOUNTER — Emergency Department (HOSPITAL_COMMUNITY): Payer: Self-pay

## 2017-06-14 ENCOUNTER — Encounter (HOSPITAL_COMMUNITY): Payer: Self-pay

## 2017-06-14 ENCOUNTER — Emergency Department (HOSPITAL_COMMUNITY)
Admission: EM | Admit: 2017-06-14 | Discharge: 2017-06-14 | Disposition: A | Discharge status: Home / Self Care | Payer: Self-pay | Attending: Emergency Medicine | Admitting: Emergency Medicine

## 2017-06-14 DIAGNOSIS — Z91199 Patient's noncompliance with other medical treatment and regimen due to unspecified reason: Secondary | ICD-10-CM

## 2017-06-14 DIAGNOSIS — Y999 Unspecified external cause status: Secondary | ICD-10-CM | POA: Insufficient documentation

## 2017-06-14 DIAGNOSIS — E119 Type 2 diabetes mellitus without complications: Secondary | ICD-10-CM | POA: Insufficient documentation

## 2017-06-14 DIAGNOSIS — I10 Essential (primary) hypertension: Secondary | ICD-10-CM | POA: Insufficient documentation

## 2017-06-14 DIAGNOSIS — F17210 Nicotine dependence, cigarettes, uncomplicated: Secondary | ICD-10-CM | POA: Insufficient documentation

## 2017-06-14 DIAGNOSIS — F10920 Alcohol use, unspecified with intoxication, uncomplicated: Secondary | ICD-10-CM

## 2017-06-14 DIAGNOSIS — M6281 Muscle weakness (generalized): Secondary | ICD-10-CM | POA: Insufficient documentation

## 2017-06-14 DIAGNOSIS — F10120 Alcohol abuse with intoxication, uncomplicated: Secondary | ICD-10-CM | POA: Insufficient documentation

## 2017-06-14 DIAGNOSIS — Z794 Long term (current) use of insulin: Secondary | ICD-10-CM | POA: Insufficient documentation

## 2017-06-14 DIAGNOSIS — Y9259 Other trade areas as the place of occurrence of the external cause: Secondary | ICD-10-CM | POA: Insufficient documentation

## 2017-06-14 DIAGNOSIS — W19XXXA Unspecified fall, initial encounter: Secondary | ICD-10-CM

## 2017-06-14 DIAGNOSIS — Z9114 Patient's other noncompliance with medication regimen: Secondary | ICD-10-CM | POA: Insufficient documentation

## 2017-06-14 DIAGNOSIS — Y939 Activity, unspecified: Secondary | ICD-10-CM | POA: Insufficient documentation

## 2017-06-14 DIAGNOSIS — Z9119 Patient's noncompliance with other medical treatment and regimen: Secondary | ICD-10-CM

## 2017-06-14 DIAGNOSIS — Y906 Blood alcohol level of 120-199 mg/100 ml: Secondary | ICD-10-CM | POA: Insufficient documentation

## 2017-06-14 HISTORY — DX: Dorsalgia, unspecified: M54.9

## 2017-06-14 HISTORY — DX: Other chronic pain: G89.29

## 2017-06-14 HISTORY — DX: Patient's noncompliance with other medical treatment and regimen due to unspecified reason: Z91.199

## 2017-06-14 HISTORY — DX: Alcohol abuse, uncomplicated: F10.10

## 2017-06-14 HISTORY — DX: Patient's noncompliance with other medical treatment and regimen: Z91.19

## 2017-06-14 HISTORY — DX: Repeated falls: R29.6

## 2017-06-14 LAB — COMPREHENSIVE METABOLIC PANEL
ALBUMIN: 3.6 g/dL (ref 3.5–5.0)
ALT: 58 U/L (ref 17–63)
ANION GAP: 11 (ref 5–15)
AST: 50 U/L — ABNORMAL HIGH (ref 15–41)
Alkaline Phosphatase: 82 U/L (ref 38–126)
BUN: 5 mg/dL — AB (ref 6–20)
CALCIUM: 9.1 mg/dL (ref 8.9–10.3)
CHLORIDE: 101 mmol/L (ref 101–111)
CO2: 26 mmol/L (ref 22–32)
CREATININE: 0.67 mg/dL (ref 0.61–1.24)
GFR calc non Af Amer: >60 mL/min (ref >60)
GLUCOSE: 382 mg/dL — AB (ref 65–99)
Potassium: 3.5 mmol/L (ref 3.5–5.1)
SODIUM: 138 mmol/L (ref 135–145)
Total Bilirubin: 0.7 mg/dL (ref 0.3–1.2)
Total Protein: 7.3 g/dL (ref 6.5–8.1)

## 2017-06-14 LAB — CBC WITH DIFFERENTIAL/PLATELET
Basophils Absolute: 0 10*3/uL (ref 0.0–0.1)
Basophils Relative: 1 %
EOS PCT: 2 %
Eosinophils Absolute: 0.1 10*3/uL (ref 0.0–0.7)
HEMATOCRIT: 41.4 % (ref 39.0–52.0)
Hemoglobin: 14.4 g/dL (ref 13.0–17.0)
LYMPHS ABS: 1.5 10*3/uL (ref 0.7–4.0)
LYMPHS PCT: 31 %
MCH: 29.8 pg (ref 26.0–34.0)
MCHC: 34.8 g/dL (ref 30.0–36.0)
MCV: 85.5 fL (ref 78.0–100.0)
MONO ABS: 0.3 10*3/uL (ref 0.1–1.0)
Monocytes Relative: 7 %
NEUTROS ABS: 3 10*3/uL (ref 1.7–7.7)
Neutrophils Relative %: 60 %
PLATELETS: 111 10*3/uL — AB (ref 150–400)
RBC: 4.84 MIL/uL (ref 4.22–5.81)
RDW: 12.7 % (ref 11.5–15.5)
WBC: 5 10*3/uL (ref 4.0–10.5)

## 2017-06-14 LAB — RAPID URINE DRUG SCREEN, HOSP PERFORMED
AMPHETAMINES: NOT DETECTED
BARBITURATES: NOT DETECTED
BENZODIAZEPINES: NOT DETECTED
Cocaine: NOT DETECTED
Opiates: POSITIVE — AB
Tetrahydrocannabinol: POSITIVE — AB

## 2017-06-14 LAB — URINALYSIS, ROUTINE W REFLEX MICROSCOPIC
BILIRUBIN URINE: NEGATIVE
Bacteria, UA: NONE SEEN
HGB URINE DIPSTICK: NEGATIVE
Ketones, ur: NEGATIVE mg/dL
Leukocytes, UA: NEGATIVE
Nitrite: NEGATIVE
Protein, ur: 100 mg/dL — AB
RBC / HPF: NONE SEEN RBC/hpf (ref 0–5)
SPECIFIC GRAVITY, URINE: 1.016 (ref 1.005–1.030)
Squamous Epithelial / LPF: NONE SEEN
pH: 5 (ref 5.0–8.0)

## 2017-06-14 LAB — ETHANOL: Alcohol, Ethyl (B): 171 mg/dL — ABNORMAL HIGH (ref <10)

## 2017-06-14 LAB — TROPONIN I: Troponin I: <0.03 ng/mL (ref <0.03)

## 2017-06-14 MED ORDER — SODIUM CHLORIDE 0.9 % IV BOLUS (SEPSIS)
1000.0000 mL | Freq: Once | INTRAVENOUS | Status: AC
  Administered 2017-06-14: 1000 mL via INTRAVENOUS

## 2017-06-14 NOTE — ED Triage Notes (Signed)
Pt reports has been drinking since around 12 today.  Pt was at a convenient store and when he stepped out of the truck, his  "legs didn't work" and he fell.  Reports bilateral leg weakness is a chronic problem for him.  Pt has abrasion to r side of head.  No loss of consciousness.  Pt also has been out of his medications and cbg 398.  Pt was also incontinent of urine.

## 2017-06-14 NOTE — ED Notes (Signed)
Notified Dr. Clarene DukeMcManus of pt.

## 2017-06-14 NOTE — ED Provider Notes (Signed)
[facility] EMERGENCY DEPARTMENT Provider Note   CSN: [id number] Arrival date & time: 06/14/17  1739     History   Chief Complaint Chief Complaint  Patient presents with  . Alcohol Intoxication  . Fall    HPI [name] is a 51 y.o. male.  The history is provided by the EMS personnel and the patient. The history is limited by the condition of the patient (Intoxicated).  Alcohol Intoxication   Fall    Pt was seen at 1830. Per EMS and pt report: Pt has been drinking etoh since noon today. Pt was at a convenience store when he stepped out of his truck and his "legs gave out." Pt states he fell to the ground. Pt denies syncope. Pt states his "legs are always weak" and endorses frequent falls "for months." Pt also c/o running out of his meds. EMS states CBG 398. Denies CP/SOB, no neck pain, no change in chronic LBP, no abd pain, no N/V/D, no focal motor weakness, no tingling/numbness in extremities.   Past Medical History:  Diagnosis Date  . Alcohol abuse   . Chronic back pain   . Diabetes mellitus   . Frequent falls   . Hypertension   . Noncompliance     Patient Active Problem List   Diagnosis Date Noted  . 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 HYPERTENSION 03/06/2007  . GERD 03/06/2007    Past Surgical History:  Procedure Laterality Date  . NECK SURGERY         Home Medications    Prior to Admission medications   Medication Sig Start Date End Date Taking? Authorizing Provider  gabapentin (NEURONTIN) 100 MG capsule Take 1 capsule (100 mg total) by mouth 3 (three) times daily. 05/04/17   Raeford RazorKohut, Stephen, MD  insulin glargine (LANTUS) 100 UNIT/ML injection Inject 0.1 mLs (10 Units total) into the skin at bedtime. 05/04/17   Raeford RazorKohut, Stephen, MD  metFORMIN (GLUCOPHAGE) 1000 MG tablet Take 1 tablet (1,000 mg total) by mouth 2 (two) times daily. 05/04/17   Raeford RazorKohut, Stephen, MD    Family History Family History  Problem Relation Age of Onset  . Diabetes  Brother     Social History Social History   Tobacco Use  . Smoking status: Current Every Day Smoker    Packs/day: 1.00    Types: Cigarettes  . Smokeless tobacco: Never Used  Substance Use Topics  . Alcohol use: Yes    Comment: heavily  . Drug use: No     Allergies   Patient has no known allergies.   Review of Systems Review of Systems  Unable to perform ROS: Other     Physical Exam Updated Vital Signs BP (!) 185/107   Pulse (!) 104   Temp 97.9 F (36.6 C) (Oral)   Resp 20   Ht 5\' 7"  (1.702 m)   Wt 72.6 kg (160 lb)   SpO2 97%   BMI 25.06 kg/m   Physical Exam 1835: Physical examination:  Nursing notes reviewed; Vital signs and O2 SAT reviewed;  Constitutional: Well developed, Well nourished, Well hydrated, In no acute distress; Head:  Normocephalic, atraumatic; Eyes: EOMI, PERRL, No scleral icterus; ENMT: Mouth and pharynx normal, Mucous membranes moist; Neck: Supple, Full range of motion, No lymphadenopathy; Cardiovascular: Tachycardic rate and rhythm, No gallop; Respiratory: Breath sounds clear & equal bilaterally, No wheezes.  Speaking full sentences with ease, Normal respiratory effort/excursion; Chest: Nontender, Movement normal; Abdomen: Soft, Nontender, Nondistended, Normal bowel sounds; Genitourinary: No CVA tenderness;  Spine:  No midline CS, TS, LS tenderness.;;  Extremities: Pulses normal, No tenderness, No edema, No calf edema or asymmetry.; Neuro: AA&Ox3, Major CN grossly intact. No facial droop. Speech clear. Grips equal. Strength 5/5 equal bilat UE's and LE's. No gross focal motor or sensory deficits in extremities.; Skin: Color normal, Warm, Dry.   ED Treatments / Results  Labs (all labs ordered are listed, but only abnormal results are displayed)   EKG  EKG Interpretation  Date/Time:  Tuesday June 14 2017 19:24:44 EST Ventricular Rate:  102 PR Interval:    QRS Duration: 90 QT Interval:  360 QTC Calculation: 469 R Axis:   80 Text  Interpretation:  Sinus tachycardia When compared with ECG of 05/04/2017 No significant change was found Confirmed by Samuel Jester [id number]) on 06/14/2017 7:31:22 PM       Radiology   Procedures Procedures (including critical care time)  Medications Ordered in ED Medications  sodium chloride 0.9 % bolus 1,000 mL (1,000 mLs Intravenous New Bag/Given 06/14/17 1948)     Initial Impression / Assessment and Plan / ED Course  I have reviewed the triage vital signs and the nursing notes.  Pertinent labs & imaging results that were available during my care of the patient were reviewed by me and considered in my medical decision making (see chart for details).  MDM Reviewed: previous chart, nursing note and vitals Reviewed previous: labs and ECG Interpretation: labs, ECG, x-ray and CT scan    Results for orders placed or performed during the hospital encounter of 06/14/17  Comprehensive metabolic panel  Result Value Ref Range   Sodium 138 135 - 145 mmol/L   Potassium 3.5 3.5 - 5.1 mmol/L   Chloride 101 101 - 111 mmol/L   CO2 26 22 - 32 mmol/L   Glucose, Bld 382 (H) 65 - 99 mg/dL   BUN 5 (L) 6 - 20 mg/dL   Creatinine, Ser 6.04 0.61 - 1.24 mg/dL   Calcium 9.1 8.9 - 54.0 mg/dL   Total Protein 7.3 6.5 - 8.1 g/dL   Albumin 3.6 3.5 - 5.0 g/dL   AST 50 (H) 15 - 41 U/L   ALT 58 17 - 63 U/L   Alkaline Phosphatase 82 38 - 126 U/L   Total Bilirubin 0.7 0.3 - 1.2 mg/dL   GFR calc non Af Amer >60 >60 mL/min   GFR calc Af Amer >60 >60 mL/min   Anion gap 11 5 - 15  Ethanol  Result Value Ref Range   Alcohol, Ethyl (B) 171 (H) <10 mg/dL  Troponin I  Result Value Ref Range   Troponin I <0.03 <0.03 ng/mL  CBC with Differential  Result Value Ref Range   WBC 5.0 4.0 - 10.5 K/uL   RBC 4.84 4.22 - 5.81 MIL/uL   Hemoglobin 14.4 13.0 - 17.0 g/dL   HCT 98.1 19.1 - 47.8 %   MCV 85.5 78.0 - 100.0 fL   MCH 29.8 26.0 - 34.0 pg   MCHC 34.8 30.0 - 36.0 g/dL   RDW 29.5 62.1 - 30.8 %    Platelets 111 (L) 150 - 400 K/uL   Neutrophils Relative % 60 %   Neutro Abs 3.0 1.7 - 7.7 K/uL   Lymphocytes Relative 31 %   Lymphs Abs 1.5 0.7 - 4.0 K/uL   Monocytes Relative 7 %   Monocytes Absolute 0.3 0.1 - 1.0 K/uL   Eosinophils Relative 2 %   Eosinophils Absolute 0.1 0.0 - 0.7 K/uL   Basophils  Relative 1 %   Basophils Absolute 0.0 0.0 - 0.1 K/uL  Urine rapid drug screen (hosp performed)  Result Value Ref Range   Opiates POSITIVE (A) NONE DETECTED   Cocaine NONE DETECTED NONE DETECTED   Benzodiazepines NONE DETECTED NONE DETECTED   Amphetamines NONE DETECTED NONE DETECTED   Tetrahydrocannabinol POSITIVE (A) NONE DETECTED   Barbiturates NONE DETECTED NONE DETECTED  Urinalysis, Routine w reflex microscopic  Result Value Ref Range   Color, Urine STRAW (A) YELLOW   APPearance CLEAR CLEAR   Specific Gravity, Urine 1.016 1.005 - 1.030   pH 5.0 5.0 - 8.0   Glucose, UA >=500 (A) NEGATIVE mg/dL   Hgb urine dipstick NEGATIVE NEGATIVE   Bilirubin Urine NEGATIVE NEGATIVE   Ketones, ur NEGATIVE NEGATIVE mg/dL   Protein, ur 098100 (A) NEGATIVE mg/dL   Nitrite NEGATIVE NEGATIVE   Leukocytes, UA NEGATIVE NEGATIVE   RBC / HPF NONE SEEN 0 - 5 RBC/hpf   WBC, UA 0-5 0 - 5 WBC/hpf   Bacteria, UA NONE SEEN NONE SEEN   Squamous Epithelial / LPF NONE SEEN NONE SEEN   Dg Chest 1 View Result Date: 06/14/2017 CLINICAL DATA:  Chest pain EXAM: CHEST 1 VIEW COMPARISON:  05/11/2005 FINDINGS: Cardiac shadow is within normal limits. The lungs are well aerated bilaterally. No focal infiltrate or sizable effusion is seen. Postsurgical changes are noted in the cervical spine new from the prior exam. No acute bony abnormality is seen. IMPRESSION: No acute abnormality noted. Electronically Signed   By: Alcide CleverMark  Lukens M.D.   On: 06/14/2017 19:26   Dg Lumbar Spine Complete Result Date: 06/14/2017 CLINICAL DATA:  Low back pain radiating into the left leg EXAM: LUMBAR SPINE - COMPLETE 4+ VIEW COMPARISON:  03/18/2015  FINDINGS: Stable compression deformity of L1 is noted. Mild osteophytic changes are seen. No pars defects are noted. No anterolisthesis is seen. No soft tissue abnormality is noted. IMPRESSION: Chronic L1 compression fracture.  No acute abnormality is noted. Electronically Signed   By: Alcide CleverMark  Lukens M.D.   On: 06/14/2017 19:28   Ct Head Wo Contrast Result Date: 06/14/2017 CLINICAL DATA:  Larey SeatFell getting out of truck after drinking since noon today. History of weakness, hypertension, diabetes. EXAM: CT HEAD WITHOUT CONTRAST CT CERVICAL SPINE WITHOUT CONTRAST TECHNIQUE: Multidetector CT imaging of the head and cervical spine was performed following the standard protocol without intravenous contrast. Multiplanar CT image reconstructions of the cervical spine were also generated. COMPARISON:  CT HEAD May 04, 2017. MRI of the cervical spine July 23, 2004 FINDINGS: CT HEAD FINDINGS BRAIN: No intraparenchymal hemorrhage, mass effect nor midline shift. Small area LEFT frontal encephalomalacia unchanged. Mild parenchymal brain volume loss. No hydrocephalus. Patchy supratentorial white matter hypodensities. No acute large vascular territory infarct. No abnormal extra-axial fluid collections. Basal cisterns are patent. VASCULAR: Moderate calcific atherosclerosis carotid siphons and included vertebral artery's. SKULL/SOFT TISSUES: No skull fracture. No significant soft tissue swelling. ORBITS/SINUSES: Old bilateral nasal bone fractures. Chronic LEFT maxillary sinusitis. Mild paranasal sinus mucosal thickening. Minimal RIGHT mastoid effusion. OTHER: None. CT CERVICAL SPINE FINDINGS ALIGNMENT: Straightened lordosis. Vertebral bodies in alignment. SKULL BASE AND VERTEBRAE: Cervical vertebral bodies and posterior elements are intact. Status post C5-6 ACDF with arthrodesis. Moderate C6-7 disc height loss and endplate spurring compatible with adjacent segment disease and degenerative disc. C1-2 articulation maintained with mild  arthropathy. Multilevel moderate upper cervical facet arthropathy. No destructive bony lesions. SOFT TISSUES AND SPINAL CANAL: Nonacute. Mild calcific atherosclerosis carotid bifurcations. DISC LEVELS: 6 mm  broad-based disc osteophyte complex resulting in moderate canal stenosis C6-7. Moderate RIGHT and moderate to severe LEFT C6-7 neural foraminal narrowing. UPPER CHEST: Apical bullous changes. OTHER: None. IMPRESSION: CT HEAD: 1. No acute intracranial process. 2. 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 3. Mild parenchymal brain volume loss and mild chronic small vessel ischemic disease, advanced for age. 4. Moderate atherosclerosis. CT CERVICAL SPINE: 1. No acute fracture or malalignment. 2. C5-6 ACDF with arthrodesis. 3. 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 Moderate C6-7 canal stenosis and moderate to severe LEFT C6-7 neural foraminal narrowing. Electronically Signed   By: Awilda Metro M.D.   On: 06/14/2017 19:38   Ct Cervical Spine Wo Contrast Result Date: 06/14/2017 CLINICAL DATA:  Larey Seat getting out of truck after drinking since noon today. History of weakness, hypertension, diabetes. EXAM: CT HEAD WITHOUT CONTRAST CT CERVICAL SPINE WITHOUT CONTRAST TECHNIQUE: Multidetector CT imaging of the head and cervical spine was performed following the standard protocol without intravenous contrast. Multiplanar CT image reconstructions of the cervical spine were also generated. COMPARISON:  CT HEAD May 04, 2017. MRI of the cervical spine July 23, 2004 FINDINGS: CT HEAD FINDINGS BRAIN: No intraparenchymal hemorrhage, mass effect nor midline shift. Small area LEFT frontal encephalomalacia unchanged. Mild parenchymal brain volume loss. No hydrocephalus. Patchy supratentorial white matter hypodensities. No acute large vascular territory infarct. No abnormal extra-axial fluid collections. Basal cisterns are patent. VASCULAR: Moderate calcific atherosclerosis carotid siphons and included vertebral artery's.  SKULL/SOFT TISSUES: No skull fracture. No significant soft tissue swelling. ORBITS/SINUSES: Old bilateral nasal bone fractures. Chronic LEFT maxillary sinusitis. Mild paranasal sinus mucosal thickening. Minimal RIGHT mastoid effusion. OTHER: None. CT CERVICAL SPINE FINDINGS ALIGNMENT: Straightened lordosis. Vertebral bodies in alignment. SKULL BASE AND VERTEBRAE: Cervical vertebral bodies and posterior elements are intact. Status post C5-6 ACDF with arthrodesis. Moderate C6-7 disc height loss and endplate spurring compatible with adjacent segment disease and degenerative disc. C1-2 articulation maintained with mild arthropathy. Multilevel moderate upper cervical facet arthropathy. No destructive bony lesions. SOFT TISSUES AND SPINAL CANAL: Nonacute. Mild calcific atherosclerosis carotid bifurcations. DISC LEVELS: 6 mm broad-based disc osteophyte complex resulting in moderate canal stenosis C6-7. Moderate RIGHT and moderate to severe LEFT C6-7 neural foraminal narrowing. UPPER CHEST: Apical bullous changes. OTHER: None. IMPRESSION: CT HEAD: 1. No acute intracranial process. 2. 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 3. Mild parenchymal brain volume loss and mild chronic small vessel ischemic disease, advanced for age. 4. Moderate atherosclerosis. CT CERVICAL SPINE: 1. No acute fracture or malalignment. 2. C5-6 ACDF with arthrodesis. 3. 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 Moderate C6-7 canal stenosis and moderate to severe LEFT C6-7 neural foraminal narrowing. Electronically Signed   By: Awilda Metro M.D.   On: 06/14/2017 19:38    2050:  Pt received IVF bolus. CIWA 0. Pt has tol PO well while in the ED without N/V. Pt has ambulated with steady gait, easy resps, NAD. Stated he was leaving. Pt then walked out of the ED with his family and without his discharge instructions.     Final Clinical Impressions(s) / ED Diagnoses   Final diagnoses:  None    ED Discharge Orders    None        Samuel Jester, DO 06/17/17 [id number]

## 2017-06-14 NOTE — ED Notes (Signed)
Pt walked out of facility without receiving discharge instructions.

## 2017-06-14 NOTE — ED Notes (Signed)
Pt ambulated without assistance around the dept.

## 2017-09-22 ENCOUNTER — Encounter (HOSPITAL_COMMUNITY): Payer: Self-pay | Admitting: Emergency Medicine

## 2017-09-22 ENCOUNTER — Emergency Department (HOSPITAL_COMMUNITY)
Admission: EM | Admit: 2017-09-22 | Discharge: 2017-09-22 | Disposition: A | Discharge status: Home / Self Care | Payer: Self-pay | Attending: Emergency Medicine | Admitting: Emergency Medicine

## 2017-09-22 ENCOUNTER — Other Ambulatory Visit: Payer: Self-pay

## 2017-09-22 DIAGNOSIS — M792 Neuralgia and neuritis, unspecified: Secondary | ICD-10-CM

## 2017-09-22 DIAGNOSIS — E119 Type 2 diabetes mellitus without complications: Secondary | ICD-10-CM | POA: Insufficient documentation

## 2017-09-22 DIAGNOSIS — G9009 Other idiopathic peripheral autonomic neuropathy: Secondary | ICD-10-CM | POA: Insufficient documentation

## 2017-09-22 DIAGNOSIS — F17210 Nicotine dependence, cigarettes, uncomplicated: Secondary | ICD-10-CM | POA: Insufficient documentation

## 2017-09-22 DIAGNOSIS — I10 Essential (primary) hypertension: Secondary | ICD-10-CM | POA: Insufficient documentation

## 2017-09-22 LAB — I-STAT CHEM 8, ED
BUN: 15 mg/dL (ref 6–20)
CREATININE: 0.5 mg/dL — AB (ref 0.61–1.24)
Calcium, Ion: 1.14 mmol/L — ABNORMAL LOW (ref 1.15–1.40)
Chloride: 94 mmol/L — ABNORMAL LOW (ref 101–111)
GLUCOSE: 301 mg/dL — AB (ref 65–99)
HCT: 38 % — ABNORMAL LOW (ref 39.0–52.0)
HEMOGLOBIN: 12.9 g/dL — AB (ref 13.0–17.0)
POTASSIUM: 4 mmol/L (ref 3.5–5.1)
Sodium: 134 mmol/L — ABNORMAL LOW (ref 135–145)
TCO2: 27 mmol/L (ref 22–32)

## 2017-09-22 LAB — CBG MONITORING, ED: Glucose-Capillary: 305 mg/dL — ABNORMAL HIGH (ref 65–99)

## 2017-09-22 NOTE — ED Notes (Signed)
Pedal Pulse R 100 Pedal Pulse L 95  Popliteal Pulse R 103 Popliteal Pulse L 103  Femoral Pulse R 103 Femoral Pulse L 103  O2 on feet: 96%

## 2017-09-22 NOTE — ED Notes (Signed)
Pulse very faint, but heard with a doppler. Feet color WNL

## 2017-09-22 NOTE — ED Notes (Signed)
Did both ankes. ABI 1.03

## 2017-09-22 NOTE — ED Provider Notes (Signed)
[facility] EMERGENCY DEPARTMENT Provider Note   CSN: [id number] Arrival date & time: 09/22/17  1517     History   Chief Complaint Chief Complaint  Patient presents with  . Peripheral Neuropathy    HPI [name] is a 52 y.o. male.     Pt was seen at 1720. Per pt, c/o gradual onset and persistence of constant peripheral neuropathy since last year. Pt describes his symptoms as "burning feet" and "can't feel my feet." Symptoms have been ongoing for the past year, worse over the past several months. Pt states his PMD sent him to the ED today "because they couldn't feel a pulse in my feet." Pt denies injury, no focal motor weakness, no fevers, no rash, no abd pain, no back pain. The symptoms have been associated with no other complaints. The patient has a significant history of similar symptoms previously, recently being evaluated for this complaint and prior eval for same.     Past Medical History:  Diagnosis Date  . Alcohol abuse   . Chronic back pain   . Diabetes mellitus   . Frequent falls   . Hypertension   . Noncompliance     Patient Active Problem List   Diagnosis Date Noted  . 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 HYPERTENSION 03/06/2007  . GERD 03/06/2007    Past Surgical History:  Procedure Laterality Date  . NECK SURGERY         Home Medications    Prior to Admission medications   Medication Sig Start Date End Date Taking? Authorizing Provider  gabapentin (NEURONTIN) 100 MG capsule Take 1 capsule (100 mg total) by mouth 3 (three) times daily. Patient not taking: Reported on 06/14/2017 05/04/17   Raeford Razor, MD  insulin glargine (LANTUS) 100 UNIT/ML injection Inject 0.1 mLs (10 Units total) into the skin at bedtime. Patient not taking: Reported on 06/14/2017 05/04/17   Raeford Razor, MD  metFORMIN (GLUCOPHAGE) 1000 MG tablet Take 1 tablet (1,000 mg total) by mouth 2 (two) times daily. Patient not taking: Reported on 06/14/2017 05/04/17    Raeford Razor, MD    Family History Family History  Problem Relation Age of Onset  . Diabetes Brother     Social History Social History   Tobacco Use  . Smoking status: Current Every Day Smoker    Packs/day: 1.00    Types: Cigarettes  . Smokeless tobacco: Never Used  Substance Use Topics  . Alcohol use: Yes    Comment: heavily  . Drug use: No     Allergies   Patient has no known allergies.   Review of Systems Review of Systems ROS: Statement: All systems negative except as marked or noted in the HPI; Constitutional: Negative for fever and chills. ; ; Eyes: Negative for eye pain, redness and discharge. ; ; ENMT: Negative for ear pain, hoarseness, nasal congestion, sinus pressure and sore throat. ; ; Cardiovascular: Negative for chest pain, palpitations, diaphoresis, dyspnea and peripheral edema. ; ; Respiratory: Negative for cough, wheezing and stridor. ; ; Gastrointestinal: Negative for nausea, vomiting, diarrhea, abdominal pain, blood in stool, hematemesis, jaundice and rectal bleeding. . ; ; Genitourinary: Negative for dysuria, flank pain and hematuria. ; ; Musculoskeletal: Negative for back pain and neck pain. Negative for swelling and trauma.; ; Skin: Negative for pruritus, rash, abrasions, blisters, bruising and skin lesion.; ; Neuro: +paresthesias bilat feet. Negative for headache, lightheadedness and neck stiffness. Negative for weakness, altered level of consciousness, altered mental status, extremity weakness, involuntary  movement, seizure and syncope.      Physical Exam Updated Vital Signs BP (!) 147/94 (BP Location: Left Arm)   Pulse (!) 101   Temp (!) 97.5 F (36.4 C) (Oral)   Resp 15   Ht 5\' 4"  (1.626 m)   Wt 71.2 kg (157 lb)   SpO2 95%   BMI 26.95 kg/m   Physical Exam 1725: Physical examination:  Nursing notes reviewed; Vital signs and O2 SAT reviewed;  Constitutional: Well developed, Well nourished, Well hydrated, In no acute distress; Head:   Normocephalic, atraumatic; Eyes: EOMI, PERRL, No scleral icterus; ENMT: Mouth and pharynx normal, Mucous membranes moist; Neck: Supple, Full range of motion, No lymphadenopathy; Cardiovascular: Regular rate and rhythm, No gallop; Respiratory: Breath sounds clear & equal bilaterally, No wheezes.  Speaking full sentences with ease, Normal respiratory effort/excursion; Chest: Nontender, Movement normal; Abdomen: Soft, Nontender, Nondistended, Normal bowel sounds; Genitourinary: No CVA tenderness; Extremities:  +Bilat LE's pulses obtained by palpation and doppler, bilat feet warm/dry/good color, brisk cap refill bilat. No deformity, no tenderness, No edema, No calf tenderness, edema or asymmetry.; Neuro: AA&Ox3, Major CN grossly intact.  Speech clear. No gross focal motor deficits in extremities. Climbs on and off stretcher easily by himself. Gait steady.; Skin: Color normal, Warm, Dry.   ED Treatments / Results  Labs (all labs ordered are listed, but only abnormal results are displayed)   EKG  EKG Interpretation None       Radiology   Procedures Procedures (including critical care time)  Medications Ordered in ED Medications - No data to display   Initial Impression / Assessment and Plan / ED Course  I have reviewed the triage vital signs and the nursing notes.  Pertinent labs & imaging results that were available during my care of the patient were reviewed by me and considered in my medical decision making (see chart for details).  MDM Reviewed: previous chart, nursing note and vitals Reviewed previous: labs Interpretation: labs   Results for orders placed or performed during the hospital encounter of 09/22/17  CBG monitoring, ED  Result Value Ref Range   Glucose-Capillary 305 (H) 65 - 99 mg/dL  I-stat Chem 8, ED  Result Value Ref Range   Sodium 134 (L) 135 - 145 mmol/L   Potassium 4.0 3.5 - 5.1 mmol/L   Chloride 94 (L) 101 - 111 mmol/L   BUN 15 6 - 20 mg/dL   Creatinine,  Ser 1.61 (L) 0.61 - 1.24 mg/dL   Glucose, Bld 096 (H) 65 - 99 mg/dL   Calcium, Ion 0.45 (L) 1.15 - 1.40 mmol/L   TCO2 27 22 - 32 mmol/L   Hemoglobin 12.9 (L) 13.0 - 17.0 g/dL   HCT 40.9 (L) 81.1 - 91.4 %     17:47:35 ED Notes EW  Pedal Pulse R 100 Pedal Pulse L 95  Popliteal Pulse R 103 Popliteal Pulse L 103  Femoral Pulse R 103 Femoral Pulse L 103  O2 on feet: 96%   18:47:37 ED Notes EW  Did both ankes. ABI 1.03    1900:  Tried to reassure pt and family regarding normal ABI's and palp pedal pulses bilat feet. Family insistent that they were sent to the ED "for testing something with his feet" but pt and family do not know what that "something" is supposed to be. T/C returned from [facility] Vasc Dr. Imogene Burn, case discussed, including:  HPI, pertinent PM/SHx, VS/PE, dx testing, ED course and treatment:  Agrees with ED workup, ABI's normal,  therefore no significant PAD, pt can f/u prn.   1915:  A different family member is now at the bedside. She too does not know what "study on his feet" they were sent to the ED for, but does endorse that pt was told in office that he had peripheral neuropathy. Pt was rx insulin and lyrica today but have not filled the rx.  Dx and testing, as well as d/w Radio producerVasc Surgeon, d/w pt and family.  Questions answered.  Verb understanding, agreeable to d/c home with outpt f/u.      Final Clinical Impressions(s) / ED Diagnoses   Final diagnoses:  None    ED Discharge Orders    None        Samuel JesterMcManus, Sebastion Jun, DO 09/25/17 1716

## 2017-09-22 NOTE — ED Triage Notes (Addendum)
Pt was sent over by PCP for no pulse felt in both feet.  Pt states "im a diabetic and cant feel my feet at all"   Pedal pulses found but fate.

## 2017-09-22 NOTE — ED Notes (Signed)
ABI: 1.03

## 2017-09-22 NOTE — Discharge Instructions (Signed)
Take your usual prescriptions as previously directed.  Call your regular medical doctor tomorrow to schedule a follow up appointment within the next 2 days.  Return to the Emergency Department immediately sooner if worsening.  ° °

## 2017-09-23 ENCOUNTER — Encounter (HOSPITAL_COMMUNITY): Payer: Self-pay | Admitting: Emergency Medicine

## 2017-09-23 ENCOUNTER — Emergency Department (HOSPITAL_COMMUNITY)
Admission: EM | Admit: 2017-09-23 | Discharge: 2017-09-23 | Disposition: A | Discharge status: Home / Self Care | Payer: Medicaid Other | Attending: Emergency Medicine | Admitting: Emergency Medicine

## 2017-09-23 DIAGNOSIS — I10 Essential (primary) hypertension: Secondary | ICD-10-CM | POA: Insufficient documentation

## 2017-09-23 DIAGNOSIS — F17210 Nicotine dependence, cigarettes, uncomplicated: Secondary | ICD-10-CM | POA: Insufficient documentation

## 2017-09-23 DIAGNOSIS — E1165 Type 2 diabetes mellitus with hyperglycemia: Secondary | ICD-10-CM | POA: Insufficient documentation

## 2017-09-23 DIAGNOSIS — R739 Hyperglycemia, unspecified: Secondary | ICD-10-CM

## 2017-09-23 DIAGNOSIS — M792 Neuralgia and neuritis, unspecified: Secondary | ICD-10-CM

## 2017-09-23 DIAGNOSIS — E1140 Type 2 diabetes mellitus with diabetic neuropathy, unspecified: Secondary | ICD-10-CM | POA: Insufficient documentation

## 2017-09-23 DIAGNOSIS — Z794 Long term (current) use of insulin: Secondary | ICD-10-CM | POA: Insufficient documentation

## 2017-09-23 LAB — CBC WITH DIFFERENTIAL/PLATELET
BASOS PCT: 1 %
Basophils Absolute: 0 10*3/uL (ref 0.0–0.1)
EOS ABS: 0.2 10*3/uL (ref 0.0–0.7)
Eosinophils Relative: 3 %
HCT: 37.7 % — ABNORMAL LOW (ref 39.0–52.0)
HEMOGLOBIN: 13.4 g/dL (ref 13.0–17.0)
Lymphocytes Relative: 29 %
Lymphs Abs: 1.5 10*3/uL (ref 0.7–4.0)
MCH: 29.6 pg (ref 26.0–34.0)
MCHC: 35.5 g/dL (ref 30.0–36.0)
MCV: 83.4 fL (ref 78.0–100.0)
MONO ABS: 0.2 10*3/uL (ref 0.1–1.0)
MONOS PCT: 5 %
NEUTROS PCT: 62 %
Neutro Abs: 3.1 10*3/uL (ref 1.7–7.7)
Platelets: 188 10*3/uL (ref 150–400)
RBC: 4.52 MIL/uL (ref 4.22–5.81)
RDW: 13.3 % (ref 11.5–15.5)
WBC: 5 10*3/uL (ref 4.0–10.5)

## 2017-09-23 LAB — BASIC METABOLIC PANEL
Anion gap: 12 (ref 5–15)
BUN: 16 mg/dL (ref 6–20)
CALCIUM: 9.1 mg/dL (ref 8.9–10.3)
CO2: 27 mmol/L (ref 22–32)
CREATININE: 0.78 mg/dL (ref 0.61–1.24)
Chloride: 94 mmol/L — ABNORMAL LOW (ref 101–111)
Glucose, Bld: 313 mg/dL — ABNORMAL HIGH (ref 65–99)
Potassium: 4.1 mmol/L (ref 3.5–5.1)
SODIUM: 133 mmol/L — AB (ref 135–145)

## 2017-09-23 MED ORDER — OXYCODONE-ACETAMINOPHEN 5-325 MG PO TABS
1.0000 | ORAL_TABLET | Freq: Once | ORAL | Status: AC
  Administered 2017-09-23: 1 via ORAL
  Filled 2017-09-23: qty 1

## 2017-09-23 NOTE — ED Provider Notes (Signed)
[facility] EMERGENCY DEPARTMENT Provider Note   CSN: [id number] Arrival date & time: 09/23/17  1414     History   Chief Complaint Chief Complaint  Patient presents with  . foot problem    HPI [name] is a 52 y.o. male.  HPI [name] is a 52 y.o. male with history of alcohol abuse, currently in remission, diabetes, hypertension, presents to emergency department complaining of bilateral foot and ankle pain.  Patient states he has had a burning sensation in his feet for several months.  He states he got worse in the last month.  He states right foot worse than left.  He was seen by family doctor and apparently sent to emergency department yesterday for decreased pulses.  While in the emergency department he was evaluated, pulses were found, ABI was obtained in both ankles, and was 1.03.  Case was discussed with vascular surgeon, and patient was discharged home with follow-up on outpatient basis.  Most likely peripheral neuropathy as the cause of his symptoms.  Patient came back today because his feet still hurting.  He states pain is worse at nighttime.  It is not worsened by activity or walking.  Nothing makes it better.  Past Medical History:  Diagnosis Date  . Alcohol abuse   . Chronic back pain   . Diabetes mellitus   . Frequent falls   . Hypertension   . Noncompliance     Patient Active Problem List   Diagnosis Date Noted  . 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 HYPERTENSION 03/06/2007  . GERD 03/06/2007    Past Surgical History:  Procedure Laterality Date  . NECK SURGERY         Home Medications    Prior to Admission medications   Medication Sig Start Date End Date Taking? Authorizing Provider  gabapentin (NEURONTIN) 100 MG capsule Take 1 capsule (100 mg total) by mouth 3 (three) times daily. Patient taking differently: Take 300 mg by mouth 3 (three) times daily.  05/04/17   Raeford Razor, MD  insulin glargine  (LANTUS) 100 UNIT/ML injection Inject 10 Units into the skin at bedtime.    [provider]  lisinopril (PRINIVIL,ZESTRIL) 10 MG tablet Take 10 mg by mouth daily.    [provider]  metFORMIN (GLUCOPHAGE) 1000 MG tablet Take 1 tablet (1,000 mg total) by mouth 2 (two) times daily. 05/04/17   Raeford Razor, MD    Family History Family History  Problem Relation Age of Onset  . Diabetes Brother     Social History Social History   Tobacco Use  . Smoking status: Current Every Day Smoker    Packs/day: 1.00    Types: Cigarettes  . Smokeless tobacco: Never Used  Substance Use Topics  . Alcohol use: Yes    Comment: heavily  . Drug use: No     Allergies   Patient has no known allergies.   Review of Systems Review of Systems  Constitutional: Negative for chills and fever.  Respiratory: Negative for cough, chest tightness and shortness of breath.   Cardiovascular: Negative for chest pain, palpitations and leg swelling.  Gastrointestinal: Negative for abdominal distention, abdominal pain, diarrhea, nausea and vomiting.  Genitourinary: Negative for dysuria, frequency, hematuria and urgency.  Musculoskeletal: Positive for arthralgias and myalgias. Negative for neck pain and neck stiffness.  Skin: Negative for rash.  Allergic/Immunologic: Negative for immunocompromised state.  Neurological: Negative for dizziness, weakness, light-headedness, numbness and headaches.     Physical Exam  Updated Vital Signs BP (!) 147/76   Pulse 67   Temp 98.3 F (36.8 C) (Oral)   Resp 16   Ht 5\' 7"  (1.702 m)   Wt 71.2 kg (157 lb)   SpO2 96%   BMI 24.59 kg/m   Physical Exam  Constitutional: He is oriented to person, place, and time. He appears well-developed and well-nourished. No distress.  HENT:  Head: Normocephalic and atraumatic.  Eyes: Conjunctivae are normal.  Neck: Neck supple.  Cardiovascular: Normal rate, regular rhythm and normal heart sounds.  Pulmonary/Chest:  Effort normal. No respiratory distress. He has no wheezes. He has no rales.  Abdominal: Soft. Bowel sounds are normal. He exhibits no distension. There is no tenderness. There is no rebound.  Musculoskeletal: He exhibits no edema.  Normal bilateral dorsalis pedis and posterior tibial pulses.  Feet do feel cold bilaterally, however they are pink, capillary refill is 3 seconds.  Neurological: He is alert and oriented to person, place, and time.  Skin: Skin is warm and dry.  Nursing note and vitals reviewed.    ED Treatments / Results  Labs (all labs ordered are listed, but only abnormal results are displayed) Labs Reviewed  BASIC METABOLIC PANEL - Abnormal; Notable for the following components:      Result Value   Sodium 133 (*)    Chloride 94 (*)    Glucose, Bld 313 (*)    All other components within normal limits  CBC WITH DIFFERENTIAL/PLATELET - Abnormal; Notable for the following components:   HCT 37.7 (*)    All other components within normal limits    EKG  EKG Interpretation None       Radiology No results found.  Procedures Procedures (including critical care time)  Medications Ordered in ED Medications  oxyCODONE-acetaminophen (PERCOCET/ROXICET) 5-325 MG per tablet 1 tablet (1 tablet Oral Given 09/23/17 1956)     Initial Impression / Assessment and Plan / ED Course  I have reviewed the triage vital signs and the nursing notes.  Pertinent labs & imaging results that were available during my care of the patient were reviewed by me and considered in my medical decision making (see chart for details).     Patient in emergency department with bilateral foot pain, this problem seems to be going on for several months now.  He does have poorly controlled diabetes and peripheral neuropathy.  I suspect this may be the cause of his symptoms.  He does have equal dorsalis pedis and posterior tibial pulses in both feet.  His feet do feel cold, however capillary refill is 3  seconds, and feet are pink.  ABI yesterday showed 1.03.  Will have patient follow-up with vascular surgeon as well as family doctor for further treatment of his peripheral neuropathy.  Patient apparently was prescribed Lyrica but he has not filled the prescription yet.  He states gabapentin not working.  Requesting something for pain.  I will given Percocet in emergency department, however explained we do not use opiates for chronic pain symptoms.  Patient agreed.  Vitals:   09/23/17 1500 09/23/17 1737  BP: 135/83 (!) 147/76  Pulse: (!) 103 67  Resp: 19 16  Temp: 98.6 F (37 C) 98.3 F (36.8 C)  TempSrc:  Oral  SpO2: 100% 96%  Weight: 71.2 kg (157 lb)   Height: 5\' 7"  (1.702 m)      Final Clinical Impressions(s) / ED Diagnoses   Final diagnoses:  Peripheral neuropathic pain  Hyperglycemia    ED  Discharge Orders    None       Jaynie CrumbleKirichenko, Fenris Cauble, Cordelia Poche-C 09/23/17 2039    Doug SouJacubowitz, Sam, MD 09/23/17 [id number]

## 2017-09-23 NOTE — Discharge Instructions (Signed)
Take gabapentin and Lyrica for your pain.  Please follow-up with a family doctor and vascular specialist for further evaluation.

## 2017-09-23 NOTE — ED Triage Notes (Addendum)
PT seen at [facility] yesterday for same. Pt states for about 1 week he has not been able to feel his feet. Pt has spots marked to feet where they had to doppler his pedal pulses yesterday. No palpable pulses to feet. Pt is diabetic. The pt symptoms were screened verbally with Abby PA.

## 2017-09-23 NOTE — ED Notes (Signed)
Patient given discharge instructions and verbalized understanding.  Patient stable to discharge at this time.  Patient is alert and oriented to baseline.  No distressed noted at this time.  All belongings taken with the patient at discharge.   

## 2017-09-29 ENCOUNTER — Encounter: Payer: Self-pay | Admitting: Internal Medicine

## 2017-11-28 DIAGNOSIS — Z139 Encounter for screening, unspecified: Secondary | ICD-10-CM

## 2017-11-28 LAB — GLUCOSE, POCT (MANUAL RESULT ENTRY): POC Glucose: 270 mg/dl — AB (ref 70–99)

## 2017-11-28 LAB — POCT GLYCOSYLATED HEMOGLOBIN (HGB A1C): Hemoglobin A1C: 10.8 % — AB (ref 4.0–5.6)

## 2017-11-28 NOTE — Congregational Nurse Program (Signed)
Congregational Nurse Program Note  Date of Encounter: 11/28/2017  Past Medical History: Past Medical History:  Diagnosis Date  . Alcohol abuse   . Chronic back pain   . Diabetes mellitus   . Frequent falls   . Hypertension   . Noncompliance     Encounter Details: CNP Questionnaire - 11/28/17 1135      Questionnaire   Patient Status  Not Applicable    Race  White or Caucasian    Location Patient Served At  [facility]  Not Applicable    Uninsured  Uninsured (NEW 1x/quarter)    Food  No food insecurities    Housing/Utilities  Yes, have permanent housing    Transportation  No transportation needs Mother or sister give transportation   Mother or sister give transportation   Interpersonal Safety  Yes, feel physically and emotionally safe where you currently live    Medication  Yes, have medication insecurities    Medical Provider  No    Referrals  Primary Care Provider/Clinic;Other    ED Visit Averted  Not Applicable    Life-Saving Intervention Made  Not Applicable      New Client to [facility] ([name]). Client is here today wanting help navigating into a primary care provider. Previously he has gone to the [facility], but is without insurance and can no longer afford out of pocket. Client was last seen in [facility] ER in March 2019 x 2 for complaints of bilateral feet and leg tingling and burning. Client currently lives with his mother. He is unemployed and does not have any income. He does only have family planning medicaid. Client is in the process of applying for Disability.   Past Medical History: (per client) Diabetes type 2 Hypertension  Past surgical History None  No known Drug allergies  Medications brought in by client Gabapentin 300 mg by mouth three times daily (client reports he is taking it differently. States he takes more if needed per MD??) Lisinopril 10 mg by mouth daily Meloxicam 7.5 mg by mouth daily Ropinirole 2 mg by mouth at bedtime  ( per client started 3 or so months ago) Metformin 1000 mg one tablet by mouth twice daily "Insulin shot" 15 units once a day (? Lantus..states he is taking)  Alert and oriented to person, place and time. Client is a very poor historian. Most inquiries are answered by " I don't know" Or "I can't remember" client appears anxious and nervous. Blood pressure elevated at 161/100 and pulse 100. Client relates that he is very nervous coming here today. Relates that he has had feelings of paranoia like "someone is out to get me" denies hearing voices or seeing things others do not see. Denies suicidal or homicidal thoughts. He states these thoughts of paranoia started when he started ropinirole. He states he does not know why he is taking this medication. Upon further questioning client thinks it could be for his legs at night. Client states he did not let his MD know about starting to have these thoughts after taking medication. RN discussed with client that anything new that he experiences after beginning a new medications such as change in moods, or thoughts, rash, swelling etc he should always contact his MD and even pharmacist. Client states understanding and states he will call Dr. Selena Batten when he leaves here today to ask about his medication while awaiting his new appointment with his new primary care. Client denied drug use on his  intake form, but when questioned he reports he smoked Marijuana about 2 days ago and that it calms him down and does not make his paranoia worse. He reports drinking maybe 1 to 2 cans a beer a week. Client continues to complain about burning and tingling in his feet and lower extremities. He also reports increased thirst, and frequent night time urination. Client states he is taking his medication for his diabetes as prescribed. His fasting glucose today 270 His A1C today is 10.8% RN discussed uncontrolled diabetes can cause nerve pain, problems with circulation, vision problems as  well as kidneys, heart etc. Client states he was never given dietary education that he is just trying to figure it out on his on. Discussed with client also the affects of alcohol on his diabetes as well as his smoking. Counseling given on smoking cessation as well as no alcohol. Handouts given regarding smoking cessation, diabetes and a copy of my plate visual. Discussed foods that he should decrease in his diet and talked about getting diabetic education. Client denies any previous referral to Endocrinologist or nutritionist. Client reports he has taken all his morning medications.  Bilateral lower extremities reddish in color, positive pedal pulses palpated. Discussed proper foot care with client. Instructed client to check his feet daily while cleaning for any signs of broken or sore places. Discussed cleaning and drying feet completely including between toes. Also discussed with client the dangers of going without proper shoes even inside and with decreased sensation and diabetes he could not possibly feel that he has stepped on something or injured his foot and he would be more prone to an infection. Client states understanding and states he does not go without shoes outside or inside.   Discussed with client options for referral for primary medical care without insurance. [facility] due to closer to where he lives. He relies on his mother and sister for transportation. Referral made and appointment secured for  11/30/17 at 1:15 pm. Contact information given as well as how to call for any cancellations.   Recheck of blood pressure prior to client leaving and client more relaxed. 147/95, pulse 95  Reminded client to call MD regarding possible medication side effect or to discuss it with new primary care on Wednesday. Client also scheduled for a GI visit with Melonie [place] that client states he has no idea what for. Encouraged client to call referring office for information and  to call and cancel with gastro today if needed. Client states understanding and agrees with plan.  Francee Nodal RN [employer] [phone]

## 2017-11-29 ENCOUNTER — Ambulatory Visit: Payer: Medicaid Other | Admitting: Gastroenterology

## 2017-11-29 ENCOUNTER — Encounter: Payer: Self-pay | Admitting: Gastroenterology

## 2017-11-30 ENCOUNTER — Ambulatory Visit: Payer: Medicaid Other | Admitting: Physician Assistant

## 2017-11-30 ENCOUNTER — Encounter: Payer: Self-pay | Admitting: Physician Assistant

## 2017-11-30 ENCOUNTER — Other Ambulatory Visit: Payer: Self-pay | Admitting: Physician Assistant

## 2017-11-30 VITALS — BP 145/90 | HR 105 | Temp 97.9°F | Ht 67.0 in | Wt 152.0 lb

## 2017-11-30 DIAGNOSIS — Z1211 Encounter for screening for malignant neoplasm of colon: Secondary | ICD-10-CM

## 2017-11-30 DIAGNOSIS — F17210 Nicotine dependence, cigarettes, uncomplicated: Secondary | ICD-10-CM

## 2017-11-30 DIAGNOSIS — E1165 Type 2 diabetes mellitus with hyperglycemia: Secondary | ICD-10-CM

## 2017-11-30 DIAGNOSIS — I10 Essential (primary) hypertension: Secondary | ICD-10-CM

## 2017-11-30 DIAGNOSIS — Z7689 Persons encountering health services in other specified circumstances: Secondary | ICD-10-CM

## 2017-11-30 DIAGNOSIS — Z13220 Encounter for screening for lipoid disorders: Secondary | ICD-10-CM

## 2017-11-30 DIAGNOSIS — F1011 Alcohol abuse, in remission: Secondary | ICD-10-CM

## 2017-11-30 DIAGNOSIS — F1910 Other psychoactive substance abuse, uncomplicated: Secondary | ICD-10-CM

## 2017-11-30 DIAGNOSIS — Z125 Encounter for screening for malignant neoplasm of prostate: Secondary | ICD-10-CM

## 2017-11-30 MED ORDER — LISINOPRIL 10 MG PO TABS: 10.0000 mg | ORAL_TABLET | Freq: Every day | ORAL | 1 refills | Status: DC

## 2017-11-30 MED ORDER — LISINOPRIL 10 MG PO TABS
10.0000 mg | ORAL_TABLET | Freq: Every day | ORAL | 1 refills | Status: DC
Start: 2017-11-30 — End: 2017-11-30

## 2017-11-30 NOTE — Progress Notes (Signed)
BP (!) 145/90 (BP Location: Right Arm, Patient Position: Sitting, Cuff Size: Normal)   Pulse (!) 105   Temp 97.9 F (36.6 C)   Ht  (1.702 m)   Wt 152 lb (68.9 kg)   SpO2 98%   BMI 23.81 kg/m    Subjective:    Patient ID: [name], male    DOB: 1965-10-29, 52 y.o.   MRN: [id number]  HPI: ALGIS LEHENBAUER is a 52 y.o. male presenting on 11/30/2017 for New Patient (Initial Visit) (previous patient of [facility]. pt last seen there about a month ago. pt unsure if insulin he uses is lantus)   HPI   Chief Complaint  Patient presents with  . New Patient (Initial Visit)    previous patient of [facility]. pt last seen there about a month ago. pt unsure if insulin he uses is lantus   t says he has only been on the insulin for about 5 months and it's still new for him.  He checks his bs about 3 times each week  Pt says he is still going to [facility] and he only came here because his lawyer told him to.  (At this point, receptionist spoke with pt and she said to continue to see pt)  Relevant past medical, surgical, family and social history reviewed and updated as indicated. Interim medical history since our last visit reviewed. Allergies and medications reviewed and updated.   Current Outpatient Medications:  .  insulin glargine (LANTUS) 100 UNIT/ML injection, Inject 15 Units into the skin at bedtime. , Disp: , Rfl:  .  metFORMIN (GLUCOPHAGE) 1000 MG tablet, Take 1 tablet (1,000 mg total) by mouth 2 (two) times daily., Disp: 60 tablet, Rfl: 0 .  gabapentin (NEURONTIN) 100 MG capsule, Take 1 capsule (100 mg total) by mouth 3 (three) times daily. (Patient not taking: Reported on 11/30/2017), Disp: 90 capsule, Rfl: 0 .  lisinopril (PRINIVIL,ZESTRIL) 10 MG tablet, Take 10 mg by mouth daily., Disp: , Rfl:  .  meloxicam (MOBIC) 7.5 MG tablet, Take 7.5 mg by mouth daily., Disp: , Rfl:  .  rOPINIRole (REQUIP) 2 MG tablet, Take 2 mg by mouth at bedtime., Disp: ,  Rfl:   Review of Systems  Constitutional: Positive for appetite change, fatigue and unexpected weight change. Negative for chills, diaphoresis and fever.  HENT: Positive for dental problem and mouth sores. Negative for congestion, drooling, ear pain, facial swelling, hearing loss, sneezing, sore throat, trouble swallowing and voice change.   Eyes: Positive for redness and visual disturbance. Negative for pain, discharge and itching.  Respiratory: Positive for cough, choking, shortness of breath and wheezing.   Cardiovascular: Positive for leg swelling. Negative for chest pain and palpitations.  Gastrointestinal: Negative for abdominal pain, blood in stool, constipation, diarrhea and vomiting.  Endocrine: Positive for polydipsia. Negative for cold intolerance and heat intolerance.  Genitourinary: Negative for decreased urine volume, dysuria and hematuria.  Musculoskeletal: Positive for gait problem. Negative for arthralgias and back pain.  Skin: Negative for rash.  Allergic/Immunologic: Negative for environmental allergies.  Neurological: Negative for seizures, syncope, light-headedness and headaches.  Hematological: Negative for adenopathy.  Psychiatric/Behavioral: Positive for dysphoric mood. Negative for agitation and suicidal ideas. The patient is nervous/anxious.     Per HPI unless specifically indicated above     Objective:    BP (!) 145/90 (BP Location: Right Arm, Patient Position: Sitting, Cuff Size: Normal)   Pulse (!) 105   Temp 97.9 F (36.6  C)   Ht  (1.702 m)   Wt 152 lb (68.9 kg)   SpO2 98%   BMI 23.81 kg/m   Wt Readings from Last 3 Encounters:  11/30/17 152 lb (68.9 kg)  11/28/17 152 lb 12.8 oz (69.3 kg)  09/23/17 157 lb (71.2 kg)    Physical Exam  Constitutional: He is oriented to person, place, and time. He appears well-developed and well-nourished.  HENT:  Head: Normocephalic and atraumatic.  Mouth/Throat: Oropharynx is clear and moist. No oropharyngeal  exudate.  Eyes: Pupils are equal, round, and reactive to light. Conjunctivae and EOM are normal.  Neck: Neck supple. No thyromegaly present.  Cardiovascular: Normal rate and regular rhythm.  Pulmonary/Chest: Effort normal and breath sounds normal. He has no wheezes. He has no rales.  Abdominal: Soft. Bowel sounds are normal. He exhibits no mass. There is no hepatosplenomegaly. There is no tenderness.  Musculoskeletal: He exhibits no edema.  Lymphadenopathy:    He has no cervical adenopathy.  Neurological: He is alert and oriented to person, place, and time.  Skin: Skin is warm and dry. No rash noted.  Psychiatric: He has a normal mood and affect. His behavior is normal. Thought content normal.  Vitals reviewed.   Results for orders placed or performed in visit on 11/28/17  POCT glucose  Result Value Ref Range   POC Glucose 270 (A) 70 - 99 mg/dl  POCT Z6X  Result Value Ref Range   Hemoglobin A1C 10.8 (A) 4.0 - 5.6 %   HbA1c, POC (prediabetic range)  5.7 - 6.4 %   HbA1c, POC (controlled diabetic range)  0.0 - 7.0 %      Assessment & Plan:   Encounter Diagnoses  Name Primary?  . Encounter to establish care Yes  . Uncontrolled type 2 diabetes mellitus with hyperglycemia (HCC)   . Screening for prostate cancer   . Screening for colon cancer   . Screening cholesterol level   . History of alcohol abuse   . Polysubstance abuse (HCC)   . Essential hypertension   . Cigarette nicotine dependence without complication     -Check lipids,  and microalbmin, PSA -stay on lantus 15u increasing by 1 unit each night until am fbs < 200.  Pt counseled to call office for fbs < 70 or > 300 -pt to Continue metformin -Get back on lisinopril -Gave iFOBT for colon cancer screening -Follow up 3 weeks with bs log

## 2017-12-02 ENCOUNTER — Other Ambulatory Visit (HOSPITAL_COMMUNITY)
Admission: RE | Admit: 2017-12-02 | Discharge: 2017-12-02 | Disposition: A | Discharge status: Home / Self Care | Payer: Medicaid Other | Source: Ambulatory Visit | Attending: Physician Assistant | Admitting: Physician Assistant

## 2017-12-02 DIAGNOSIS — E1165 Type 2 diabetes mellitus with hyperglycemia: Secondary | ICD-10-CM | POA: Insufficient documentation

## 2017-12-02 DIAGNOSIS — I10 Essential (primary) hypertension: Secondary | ICD-10-CM

## 2017-12-02 DIAGNOSIS — Z87898 Personal history of other specified conditions: Secondary | ICD-10-CM | POA: Insufficient documentation

## 2017-12-02 DIAGNOSIS — Z13220 Encounter for screening for lipoid disorders: Secondary | ICD-10-CM

## 2017-12-02 DIAGNOSIS — Z125 Encounter for screening for malignant neoplasm of prostate: Secondary | ICD-10-CM | POA: Insufficient documentation

## 2017-12-02 DIAGNOSIS — F1011 Alcohol abuse, in remission: Secondary | ICD-10-CM

## 2017-12-02 LAB — COMPREHENSIVE METABOLIC PANEL
ALT: 37 U/L (ref 17–63)
ANION GAP: 7 (ref 5–15)
AST: 28 U/L (ref 15–41)
Albumin: 3.8 g/dL (ref 3.5–5.0)
Alkaline Phosphatase: 53 U/L (ref 38–126)
BUN: 13 mg/dL (ref 6–20)
CHLORIDE: 94 mmol/L — AB (ref 101–111)
CO2: 31 mmol/L (ref 22–32)
Calcium: 9.2 mg/dL (ref 8.9–10.3)
Creatinine, Ser: 0.73 mg/dL (ref 0.61–1.24)
GFR calc Af Amer: >60 mL/min (ref >60)
Glucose, Bld: 187 mg/dL — ABNORMAL HIGH (ref 65–99)
POTASSIUM: 4.2 mmol/L (ref 3.5–5.1)
SODIUM: 132 mmol/L — AB (ref 135–145)
Total Bilirubin: 0.5 mg/dL (ref 0.3–1.2)
Total Protein: 7.3 g/dL (ref 6.5–8.1)

## 2017-12-02 LAB — PSA: PROSTATIC SPECIFIC ANTIGEN: 2.7 ng/mL (ref 0.00–4.00)

## 2017-12-02 LAB — LIPID PANEL
Cholesterol: 127 mg/dL (ref 0–200)
HDL: 28 mg/dL — AB (ref >40)
LDL CALC: 42 mg/dL (ref 0–99)
TRIGLYCERIDES: 283 mg/dL — AB (ref <150)
Total CHOL/HDL Ratio: 4.5 RATIO
VLDL: 57 mg/dL — AB (ref 0–40)

## 2017-12-03 LAB — MICROALBUMIN, URINE: MICROALB UR: 52.5 ug/mL — AB

## 2017-12-07 ENCOUNTER — Telehealth: Payer: Self-pay

## 2017-12-07 NOTE — Telephone Encounter (Signed)
Pt was here on 11/28/17 needing assistance into a primary care provider. After intake was taken, RN assessed pt and referred him into the [facility] and an appointment was secured for 11/30/17 at 1:15pm.   Pt returned phone call. Pt states he is taking his blood pressure medication and diabetes medication and checking his glucose and keeping a log as the provider asked him to. Pt says he has been to get his blood work done but that his legs have been hurting so bad. Pt states " my legs hurt so bad I can't sleep". Advised pt to [facility] and to listen to the prompt and speak to the nurse and if she doesn't pick up to leave a message. Pt understood.   [name] R. [place], LPN [phone]

## 2017-12-07 NOTE — Telephone Encounter (Signed)
Pt was here on 11/28/17 needing assistance into a primary care provider. After intake was taken, RN assessed pt and referred him into the [facility] and an appointment was secured for 11/30/17 at 1:15pm.   Follow-up call was made no answer. Voice mail was not set up, was not able to leave a message. Will attempt to call another day.    Amon Costilla R. [place], LPN [phone]

## 2017-12-08 LAB — IFOBT (OCCULT BLOOD): IMMUNOLOGICAL FECAL OCCULT BLOOD TEST: POSITIVE

## 2017-12-22 ENCOUNTER — Ambulatory Visit: Payer: Medicaid Other | Admitting: Physician Assistant

## 2017-12-28 ENCOUNTER — Encounter: Payer: Self-pay | Admitting: Physician Assistant

## 2017-12-28 ENCOUNTER — Ambulatory Visit: Payer: Medicaid Other | Admitting: Physician Assistant

## 2017-12-28 VITALS — BP 152/93 | HR 114 | Temp 98.1°F | Ht 67.0 in | Wt 157.5 lb

## 2017-12-28 DIAGNOSIS — E785 Hyperlipidemia, unspecified: Secondary | ICD-10-CM

## 2017-12-28 DIAGNOSIS — I10 Essential (primary) hypertension: Secondary | ICD-10-CM

## 2017-12-28 DIAGNOSIS — F17210 Nicotine dependence, cigarettes, uncomplicated: Secondary | ICD-10-CM

## 2017-12-28 DIAGNOSIS — R195 Other fecal abnormalities: Secondary | ICD-10-CM

## 2017-12-28 DIAGNOSIS — E1165 Type 2 diabetes mellitus with hyperglycemia: Secondary | ICD-10-CM | POA: Insufficient documentation

## 2017-12-28 MED ORDER — FISH OIL 500 MG PO CAPS: 1.0000 | ORAL_CAPSULE | Freq: Every day | ORAL | 0 refills | Status: AC

## 2017-12-28 MED ORDER — GABAPENTIN 100 MG PO CAPS: 100.0000 mg | ORAL_CAPSULE | Freq: Two times a day (BID) | ORAL | 0 refills | Status: DC

## 2017-12-28 MED ORDER — LISINOPRIL 20 MG PO TABS: 20.0000 mg | ORAL_TABLET | Freq: Every day | ORAL | 3 refills | Status: AC

## 2017-12-28 MED ORDER — METFORMIN HCL 1000 MG PO TABS: 1000.0000 mg | ORAL_TABLET | Freq: Two times a day (BID) | ORAL | 1 refills | Status: AC

## 2017-12-28 NOTE — Progress Notes (Signed)
BP (!) 152/93 (BP Location: Right Arm, Patient Position: Sitting, Cuff Size: Normal)   Pulse (!) 114   Temp 98.1 F (36.7 C) (Other (Comment))   Ht 5\' 7"  (1.702 m)   Wt 157 lb 8 oz (71.4 kg)   SpO2 98%   BMI 24.67 kg/m    Subjective:    Patient ID: Leon LeitzBilly J Poage, male    DOB: Jan 20, 1966, 52 y.o.   MRN: [id number]  HPI: [name] is a 52 y.o. male presenting on 12/28/2017 for Diabetes and Hypertension   HPI   Pt is using 15u lantus.  He didn't increase it as discussed.  He didn't bring his bs log with him.  He says today it was 246 before breakfast today and that was usual for lately  Pt is feeling fine today and has no complaints  He is still smoking     Relevant past medical, surgical, family and social history reviewed and updated as indicated. Interim medical history since our last visit reviewed. Allergies and medications reviewed and updated.   Current Outpatient Medications:  .  insulin glargine (LANTUS) 100 UNIT/ML injection, Inject 15 Units into the skin at bedtime. , Disp: , Rfl:  .  lisinopril (PRINIVIL,ZESTRIL) 10 MG tablet, Take 1 tablet (10 mg total) by mouth daily., Disp: 30 tablet, Rfl: 1 .  meloxicam (MOBIC) 7.5 MG tablet, Take 7.5 mg by mouth daily., Disp: , Rfl:  .  metFORMIN (GLUCOPHAGE) 1000 MG tablet, Take 1 tablet (1,000 mg total) by mouth 2 (two) times daily., Disp: 60 tablet, Rfl: 0 .  rOPINIRole (REQUIP) 2 MG tablet, Take 2 mg by mouth at bedtime., Disp: , Rfl:  .  gabapentin (NEURONTIN) 100 MG capsule, Take 1 capsule (100 mg total) by mouth 3 (three) times daily. (Patient not taking: Reported on 11/30/2017), Disp: 90 capsule, Rfl: 0   Review of Systems  Constitutional: Positive for appetite change and unexpected weight change. Negative for chills, diaphoresis, fatigue and fever.  HENT: Positive for dental problem. Negative for congestion, drooling, ear pain, facial swelling, hearing loss, mouth sores, sneezing, sore throat, trouble  swallowing and voice change.   Eyes: Positive for redness. Negative for pain, discharge, itching and visual disturbance.  Respiratory: Positive for shortness of breath. Negative for cough, choking and wheezing.   Cardiovascular: Positive for leg swelling. Negative for chest pain and palpitations.  Gastrointestinal: Negative for abdominal pain, blood in stool, constipation, diarrhea and vomiting.  Endocrine: Negative for cold intolerance, heat intolerance and polydipsia.  Genitourinary: Negative for decreased urine volume, dysuria and hematuria.  Musculoskeletal: Negative for arthralgias, back pain and gait problem.  Skin: Negative for rash.  Allergic/Immunologic: Negative for environmental allergies.  Neurological: Negative for seizures, syncope, light-headedness and headaches.  Hematological: Negative for adenopathy.  Psychiatric/Behavioral: Negative for agitation, dysphoric mood and suicidal ideas. The patient is not nervous/anxious.     Per HPI unless specifically indicated above     Objective:    BP (!) 152/93 (BP Location: Right Arm, Patient Position: Sitting, Cuff Size: Normal)   Pulse (!) 114   Temp 98.1 F (36.7 C) (Other (Comment))   Ht 5\' 7"  (1.702 m)   Wt 157 lb 8 oz (71.4 kg)   SpO2 98%   BMI 24.67 kg/m   Wt Readings from Last 3 Encounters:  12/28/17 157 lb 8 oz (71.4 kg)  11/30/17 152 lb (68.9 kg)  11/28/17 152 lb 12.8 oz (69.3 kg)    Physical Exam  Constitutional: He is  oriented to person, place, and time. He appears well-developed and well-nourished.  HENT:  Head: Normocephalic and atraumatic.  Neck: Neck supple.  Cardiovascular: Normal rate and regular rhythm.  Pulmonary/Chest: Effort normal and breath sounds normal. He has no wheezes.  Abdominal: Soft. Bowel sounds are normal. There is no hepatosplenomegaly. There is no tenderness.  Musculoskeletal: He exhibits no edema.  Lymphadenopathy:    He has no cervical adenopathy.  Neurological: He is alert and  oriented to person, place, and time.  Skin: Skin is warm and dry.  Psychiatric: He has a normal mood and affect. His behavior is normal.  Vitals reviewed.   Results for orders placed or performed during the hospital encounter of 12/02/17  Lipid panel  Result Value Ref Range   Cholesterol 127 0 - 200 mg/dL   Triglycerides 161 (H) <150 mg/dL   HDL 28 (L) >09 mg/dL   Total CHOL/HDL Ratio 4.5 RATIO   VLDL 57 (H) 0 - 40 mg/dL   LDL Cholesterol 42 0 - 99 mg/dL  Comprehensive metabolic panel  Result Value Ref Range   Sodium 132 (L) 135 - 145 mmol/L   Potassium 4.2 3.5 - 5.1 mmol/L   Chloride 94 (L) 101 - 111 mmol/L   CO2 31 22 - 32 mmol/L   Glucose, Bld 187 (H) 65 - 99 mg/dL   BUN 13 6 - 20 mg/dL   Creatinine, Ser 6.04 0.61 - 1.24 mg/dL   Calcium 9.2 8.9 - 54.0 mg/dL   Total Protein 7.3 6.5 - 8.1 g/dL   Albumin 3.8 3.5 - 5.0 g/dL   AST 28 15 - 41 U/L   ALT 37 17 - 63 U/L   Alkaline Phosphatase 53 38 - 126 U/L   Total Bilirubin 0.5 0.3 - 1.2 mg/dL   GFR calc non Af Amer >60 >60 mL/min   GFR calc Af Amer >60 >60 mL/min   Anion gap 7 5 - 15  PSA  Result Value Ref Range   Prostatic Specific Antigen 2.70 0.00 - 4.00 ng/mL  Microalbumin, urine  Result Value Ref Range   Microalb, Ur 52.5 (H) Not Estab. ug/mL      Assessment & Plan:   Encounter Diagnoses  Name Primary?  Marland Kitchen Uncontrolled type 2 diabetes mellitus with hyperglycemia (HCC) Yes  . Essential hypertension   . Positive fecal occult blood test   . Cigarette nicotine dependence without complication   . Hyperlipidemia, unspecified hyperlipidemia type     -reviewed labs with pt  -will Increase lisinopril to 20mg  qd.  -pt to continue metformin -he will Increase lantus to 20units qhs.  He is to monitor fbs.  He is reminded to call office for fbs < 70 or > 300 -Add fish oil 1 daily and Eat lowfat diet -pt was signed up for [facility] medication assistance program (Metformin, gabapentin) -Refer to Gastroenterology for  positive iFOBT -Pt was given [facility] application -will refer for diabetic eye exam -pt to follow up in 4 weeks with bs log and will recheck blood pressure.  Pt to RTO sooner prn

## 2017-12-28 NOTE — Patient Instructions (Addendum)
For [employer] [facility] please bring: Letter of Support  Cholesterol Cholesterol is a white, waxy, fat-like substance that is needed by the human body in small amounts. The liver makes all the cholesterol we need. Cholesterol is carried from the liver by the blood through the blood vessels. Deposits of cholesterol (plaques) may build up on blood vessel (artery) walls. Plaques make the arteries narrower and stiffer. Cholesterol plaques increase the risk for heart attack and stroke. You cannot feel your cholesterol level even if it is very high. The only way to know that it is high is to have a blood test. Once you know your cholesterol levels, you should keep a record of the test results. Work with your health care provider to keep your levels in the desired range. What do the results mean?  Total cholesterol is a rough measure of all the cholesterol in your blood.  LDL (low-density lipoprotein) is the "bad" cholesterol. This is the type that causes plaque to build up on the artery walls. You want this level to be low.  HDL (high-density lipoprotein) is the "good" cholesterol because it cleans the arteries and carries the LDL away. You want this level to be high.  Triglycerides are fat that the body can either burn for energy or store. High levels are closely linked to heart disease. What are the desired levels of cholesterol?  Total cholesterol below 200.  LDL below 100 for people who are at risk, below 70 for people at very high risk.  HDL above 40 is good. A level of 60 or higher is considered to be protective against heart disease.  Triglycerides below 150. How can I lower my cholesterol? Diet Follow your diet program as told by your health care provider.  Choose fish or white meat chicken and [place], roasted or baked. Limit fatty cuts of red meat, fried foods, and processed meats, such as sausage and lunch meats.  Eat lots of fresh fruits and vegetables.  Choose whole grains, beans,  pasta, potatoes, and cereals.  Choose olive oil, corn oil, or canola oil, and use only small amounts.  Avoid butter, mayonnaise, shortening, or palm kernel oils.  Avoid foods with trans fats.  Drink skim or nonfat milk and eat low-fat or nonfat yogurt and cheeses. Avoid whole milk, cream, ice cream, egg yolks, and full-fat cheeses.  Healthier desserts include angel food cake, ginger snaps, animal crackers, hard candy, popsicles, and low-fat or nonfat frozen yogurt. Avoid pastries, cakes, pies, and cookies.  Exercise  Follow your exercise program as told by your health care provider. A regular program: ? Helps to decrease LDL and raise HDL. ? Helps with weight control.  Do things that increase your activity level, such as gardening, walking, and taking the stairs.  Ask your health care provider about ways that you can be more active in your daily life.  Medicine  Take over-the-counter and prescription medicines only as told by your health care provider. ? Medicine may be prescribed by your health care provider to help lower cholesterol and decrease the risk for heart disease. This is usually done if diet and exercise have failed to bring down cholesterol levels. ? If you have several risk factors, you may need medicine even if your levels are normal.  This information is not intended to replace advice given to you by your health care provider. Make sure you discuss any questions you have with your health care provider. Document Released: 03/23/2001 Document Revised: 01/24/2016 Document Reviewed: 12/27/2015 Elsevier  Interactive Patient Education  [employer].

## 2018-01-03 ENCOUNTER — Encounter: Payer: Self-pay | Admitting: Internal Medicine

## 2018-01-05 ENCOUNTER — Other Ambulatory Visit: Payer: Self-pay | Admitting: Physician Assistant

## 2018-01-25 ENCOUNTER — Encounter: Payer: Self-pay | Admitting: Physician Assistant

## 2018-01-25 ENCOUNTER — Ambulatory Visit: Payer: Medicaid Other | Admitting: Physician Assistant

## 2018-01-25 VITALS — BP 126/81 | HR 113 | Temp 99.0°F | Ht 67.0 in | Wt 161.0 lb

## 2018-01-25 DIAGNOSIS — F17210 Nicotine dependence, cigarettes, uncomplicated: Secondary | ICD-10-CM

## 2018-01-25 DIAGNOSIS — I10 Essential (primary) hypertension: Secondary | ICD-10-CM

## 2018-01-25 DIAGNOSIS — E785 Hyperlipidemia, unspecified: Secondary | ICD-10-CM

## 2018-01-25 DIAGNOSIS — R000 Tachycardia, unspecified: Secondary | ICD-10-CM

## 2018-01-25 DIAGNOSIS — E1165 Type 2 diabetes mellitus with hyperglycemia: Secondary | ICD-10-CM

## 2018-01-25 DIAGNOSIS — E1142 Type 2 diabetes mellitus with diabetic polyneuropathy: Secondary | ICD-10-CM

## 2018-01-25 MED ORDER — METOPROLOL TARTRATE 25 MG PO TABS: 25.0000 mg | ORAL_TABLET | Freq: Two times a day (BID) | ORAL | 0 refills | Status: AC

## 2018-01-25 MED ORDER — GABAPENTIN 300 MG PO CAPS: 300.0000 mg | ORAL_CAPSULE | Freq: Two times a day (BID) | ORAL | 0 refills | Status: AC

## 2018-01-25 NOTE — Progress Notes (Signed)
BP 126/81 (BP Location: Right Arm, Patient Position: Sitting, Cuff Size: Normal)   Pulse (!) 113   Temp 99 F (37.2 C) (Other (Comment))   Ht 5\' 7"  (1.702 m)   Wt 161 lb (73 kg)   SpO2 98%   BMI 25.22 kg/m    Subjective:    Patient ID: [name], male    DOB: [birth year], 52 y.o.   MRN: [id number]  HPI: Leon LeitzBilly J Szymczak is a 52 y.o. male presenting on 01/25/2018 for Hypertension and Diabetes   HPI   Pt is trying to get medicaid.  He says he doesn't know if he was approved or not  Pt has bs log- values range 88-248  Pt says his gabapentin helped but he needs more  Relevant past medical, surgical, family and social history reviewed and updated as indicated. Interim medical history since our last visit reviewed. Allergies and medications reviewed and updated.   Current Outpatient Medications:  .  gabapentin (NEURONTIN) 100 MG capsule, Take 1 capsule (100 mg total) by mouth 2 (two) times daily., Disp: 180 capsule, Rfl: 0 .  insulin glargine (LANTUS) 100 UNIT/ML injection, Inject 15 Units into the skin at bedtime. , Disp: , Rfl:  .  lisinopril (PRINIVIL,ZESTRIL) 20 MG tablet, Take 1 tablet (20 mg total) by mouth daily., Disp: 30 tablet, Rfl: 3 .  meloxicam (MOBIC) 7.5 MG tablet, Take 7.5 mg by mouth daily., Disp: , Rfl:  .  metFORMIN (GLUCOPHAGE) 1000 MG tablet, Take 1 tablet (1,000 mg total) by mouth 2 (two) times daily., Disp: 180 tablet, Rfl: 1 .  Omega-3 Fatty Acids (FISH OIL) 500 MG CAPS, Take 1 capsule (500 mg total) by mouth daily., Disp: , Rfl: 0   Review of Systems  Constitutional: Positive for unexpected weight change. Negative for appetite change, chills, diaphoresis, fatigue and fever.  HENT: Positive for dental problem. Negative for congestion, drooling, ear pain, facial swelling, hearing loss, mouth sores, sneezing, sore throat, trouble swallowing and voice change.   Eyes: Positive for redness. Negative for pain, discharge, itching and visual disturbance.   Respiratory: Positive for shortness of breath. Negative for cough, choking and wheezing.   Cardiovascular: Positive for leg swelling. Negative for chest pain and palpitations.  Gastrointestinal: Negative for abdominal pain, blood in stool, constipation, diarrhea and vomiting.  Endocrine: Negative for cold intolerance, heat intolerance and polydipsia.  Genitourinary: Negative for decreased urine volume, dysuria and hematuria.  Musculoskeletal: Positive for gait problem. Negative for arthralgias and back pain.  Skin: Negative for rash.  Allergic/Immunologic: Negative for environmental allergies.  Neurological: Negative for seizures, syncope, light-headedness and headaches.  Hematological: Negative for adenopathy.  Psychiatric/Behavioral: Positive for dysphoric mood. Negative for agitation and suicidal ideas. The patient is nervous/anxious.     Per HPI unless specifically indicated above     Objective:    BP 126/81 (BP Location: Right Arm, Patient Position: Sitting, Cuff Size: Normal)   Pulse (!) 113   Temp 99 F (37.2 C) (Other (Comment))   Ht 5\' 7"  (1.702 m)   Wt 161 lb (73 kg)   SpO2 98%   BMI 25.22 kg/m   Wt Readings from Last 3 Encounters:  01/25/18 161 lb (73 kg)  12/28/17 157 lb 8 oz (71.4 kg)  11/30/17 152 lb (68.9 kg)    Physical Exam  Constitutional: He is oriented to person, place, and time. He appears well-developed and well-nourished.  HENT:  Head: Normocephalic and atraumatic.  Neck: Neck supple.  Cardiovascular: Normal rate  and regular rhythm.  Pulmonary/Chest: Effort normal and breath sounds normal. He has no wheezes.  Abdominal: Soft. Bowel sounds are normal. There is no hepatosplenomegaly. There is no tenderness.  Musculoskeletal: He exhibits no edema.  Lymphadenopathy:    He has no cervical adenopathy.  Neurological: He is alert and oriented to person, place, and time.  Skin: Skin is warm and dry.  Psychiatric: He has a normal mood and affect. His  behavior is normal.  Vitals reviewed.       Assessment & Plan:    Encounter Diagnoses  Name Primary?  Marland Kitchen Uncontrolled type 2 diabetes mellitus with hyperglycemia (HCC) Yes  . Essential hypertension   . Hyperlipidemia, unspecified hyperlipidemia type   . Tachycardia   . Cigarette nicotine dependence without complication   . Diabetic polyneuropathy associated with type 2 diabetes mellitus (HCC)     -add metoprolol for fast pulse -watch diabetic diet and continue to monitor fbs -pt to follow up 1 month to recheck.  RTO sooner prn

## 2018-02-09 DEATH — deceased

## 2018-02-22 ENCOUNTER — Ambulatory Visit: Payer: Medicaid Other | Admitting: Physician Assistant

## 2018-03-17 ENCOUNTER — Ambulatory Visit: Payer: Medicaid Other | Admitting: Gastroenterology

## 2018-05-28 IMAGING — CT CT HEAD W/O CM
5 of 7 series · 16 of 47 positions shown, 17 images · non-contrast
Comparison: CT HEAD May 04, 2017. MRI of the cervical spine
July 23, 2004

CLINICAL DATA: Fell getting out of truck after drinking since noon
today. History of weakness, hypertension, diabetes.

EXAM:
CT HEAD WITHOUT CONTRAST
CT CERVICAL SPINE WITHOUT CONTRAST
TECHNIQUE: Multidetector CT imaging of the head and cervical spine was
performed following the standard protocol without intravenous
contrast. Multiplanar CT image reconstructions of the cervical spine
were also generated.

[Series 3: head wo · axial · 0.44mm/px · z∈[+1609,+1664]mm · 2 of 33 slices shown, 3 images]
[im 11/33  brain]
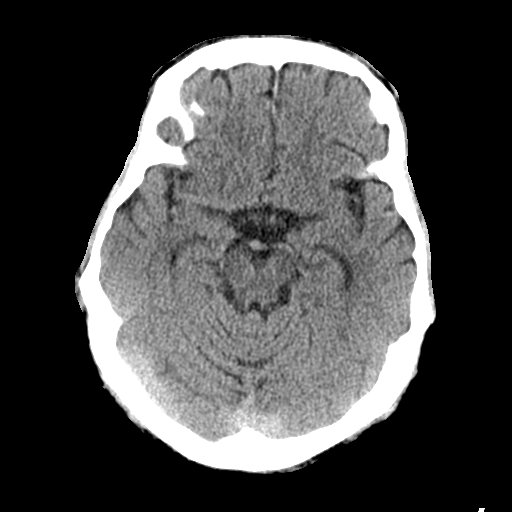
[im 11/33  bone]
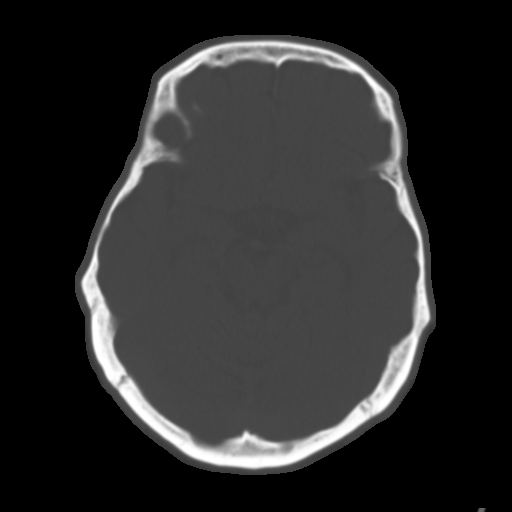
[im 22/33  brain]
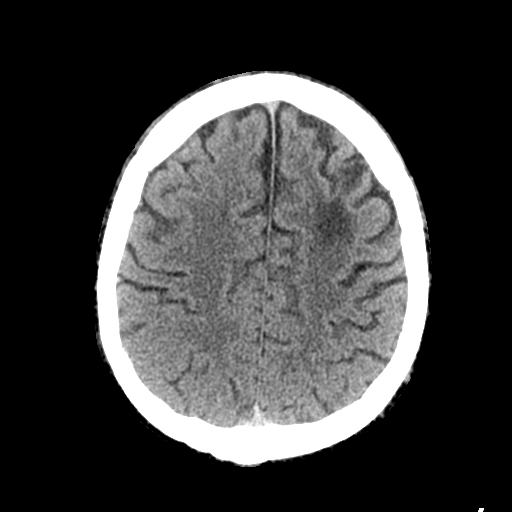

[Series 5: coronal soft tissue · coronal · 0.34mm/px · 3 of 70 slices shown]
[im 26/70  brain]
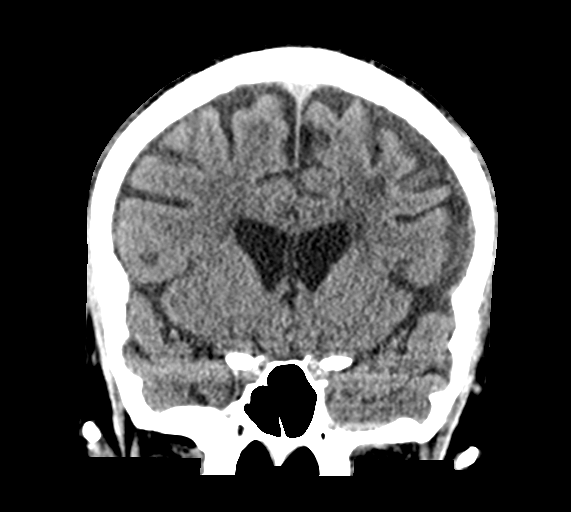
[im 35/70  brain]
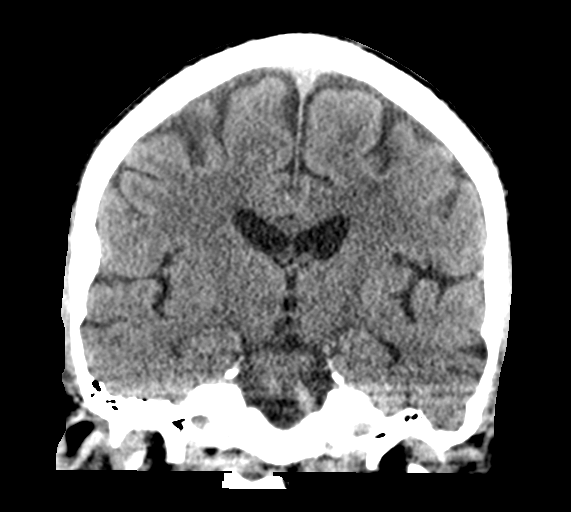
[im 44/70  brain]
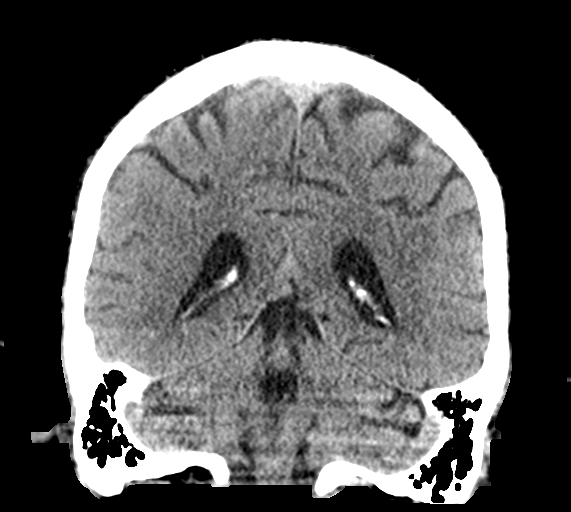

[Series 6: sagittal soft tissue · sagittal · 0.34mm/px · 1 of 59 slices shown]
[im 30/59  brain]
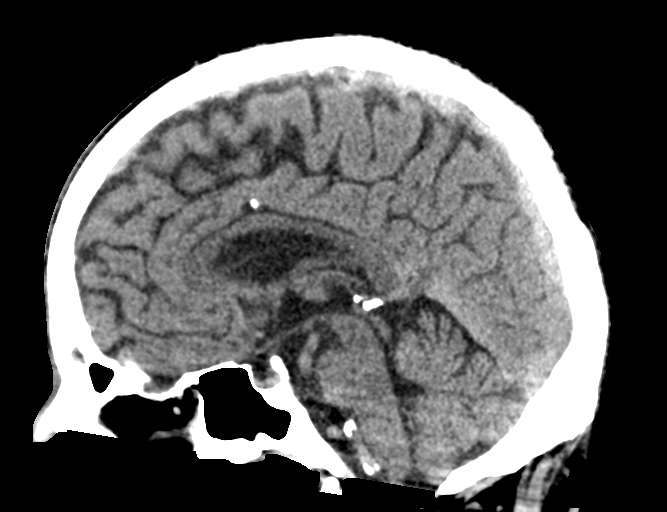

[Series 8: c spine soft · axial · 0.31mm/px · z∈[+1396,+1428]mm · 2 of 93 slices shown]
[im 8/93  brain]
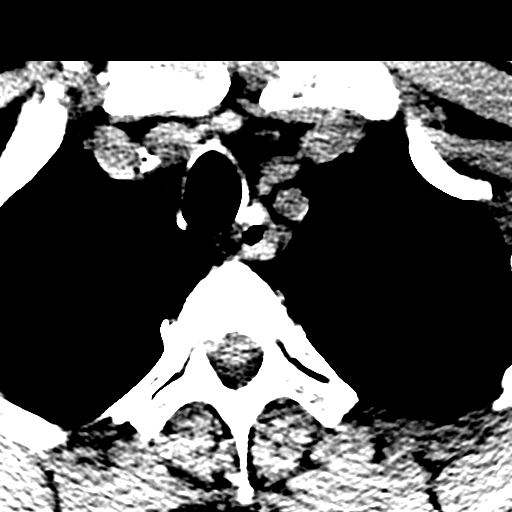
[im 24/93  brain]
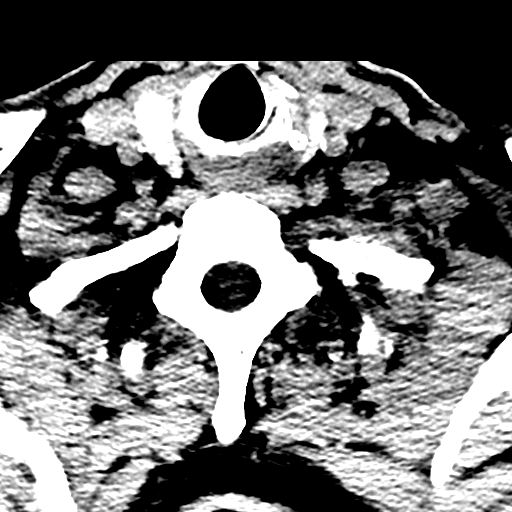

[Series 11: orthogonal bone · axial · 0.21mm/px · z∈[+1387,+1538]mm · 8 of 93 slices shown]
[im 8/93  bone]
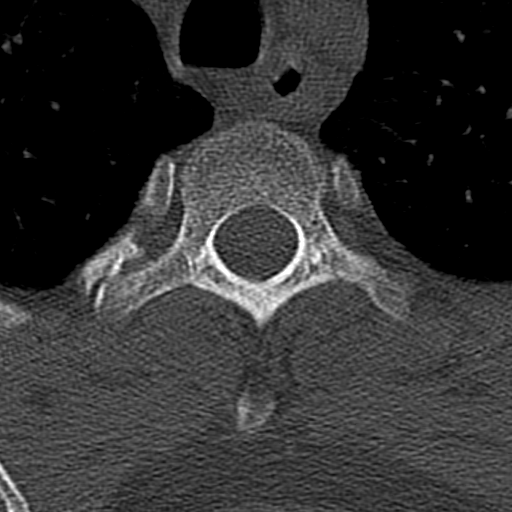
[im 24/93  bone]
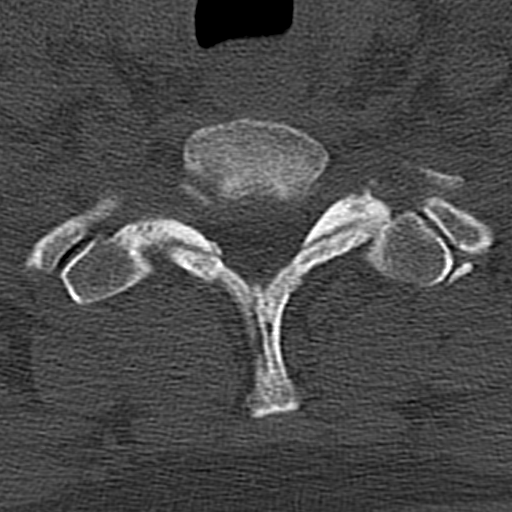
[im 31/93  bone]
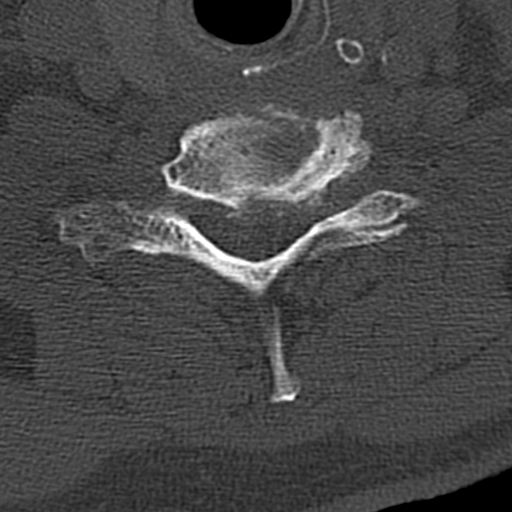
[im 39/93  bone]
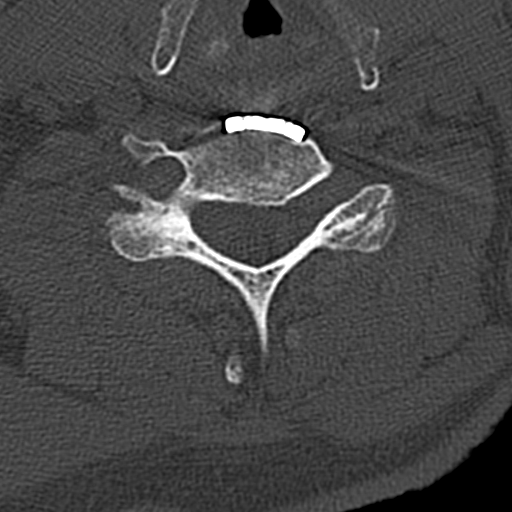
[im 54/93  bone]
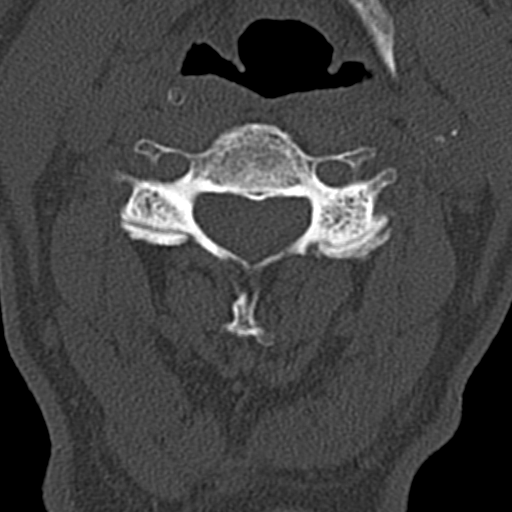
[im 62/93  bone]
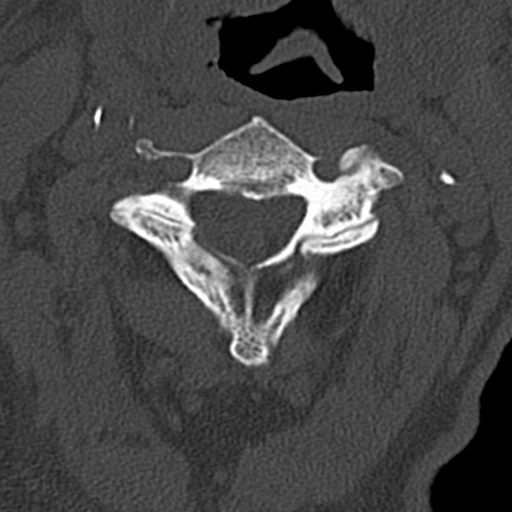
[im 70/93  bone]
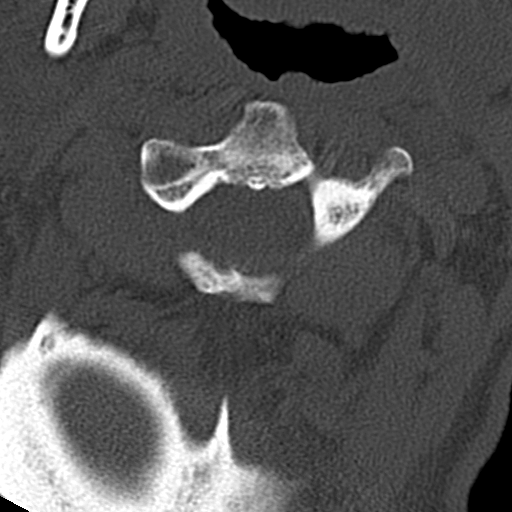
[im 85/93  bone]
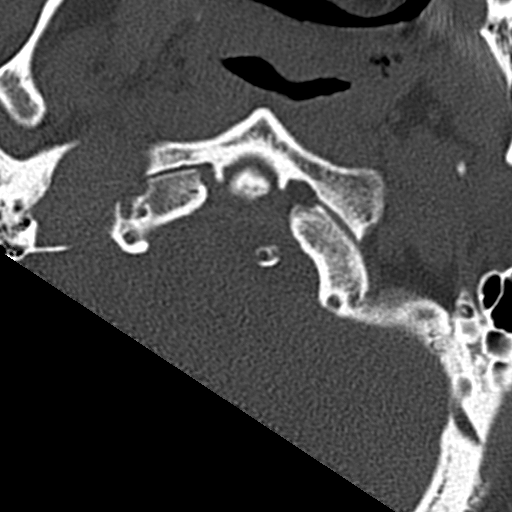

[16 of 47 positions shown; findings below may reference images not displayed]

FINDINGS: CT HEAD FINDINGS

BRAIN: No intraparenchymal hemorrhage, mass effect nor midline
shift. Small area LEFT frontal encephalomalacia unchanged. Mild
parenchymal brain volume loss. No hydrocephalus. Patchy
supratentorial white matter hypodensities. No acute large vascular
territory infarct. No abnormal extra-axial fluid collections. Basal
cisterns are patent.

VASCULAR: Moderate calcific atherosclerosis carotid siphons and
included vertebral artery's.

SKULL/SOFT TISSUES: No skull fracture. No significant soft tissue
swelling.

ORBITS/SINUSES: Old bilateral nasal bone fractures. Chronic LEFT
maxillary sinusitis. Mild paranasal sinus mucosal thickening.
Minimal RIGHT mastoid effusion.

OTHER: None.

CT CERVICAL SPINE FINDINGS

ALIGNMENT: Straightened lordosis. Vertebral bodies in alignment.

SKULL BASE AND VERTEBRAE: Cervical vertebral bodies and posterior
elements are intact. Status post C5-6 ACDF with arthrodesis.
Moderate C6-7 disc height loss and endplate spurring compatible with
adjacent segment disease and degenerative disc. C1-2 articulation
maintained with mild arthropathy. Multilevel moderate upper cervical
facet arthropathy. No destructive bony lesions.

SOFT TISSUES AND SPINAL CANAL: Nonacute. Mild calcific
atherosclerosis carotid bifurcations.

DISC LEVELS: 6 mm broad-based disc osteophyte complex resulting in
moderate canal stenosis C6-7. Moderate RIGHT and moderate to severe
LEFT C6-7 neural foraminal narrowing.

UPPER CHEST: Apical bullous changes.

OTHER: None.
IMPRESSION: CT HEAD:

1. No acute intracranial process.
2. Old LEFT frontal lobe/MCA territory infarct.
3. Mild parenchymal brain volume loss and mild chronic small vessel
ischemic disease, advanced for age.
4. Moderate atherosclerosis.
CT CERVICAL SPINE:

1. No acute fracture or malalignment.
2. C5-6 ACDF with arthrodesis.
3. C6-7 adjacent segment disease. Moderate C6-7 canal stenosis and
moderate to severe LEFT C6-7 neural foraminal narrowing.
